# Patient Record
Sex: Female | Born: 1970 | Race: White | Hispanic: No | Marital: Single | Smoking: Never smoker
Health system: Southern US, Community
[De-identification: ages and names within clinical notes are randomized; demographics above are authoritative.]

## PROBLEM LIST (undated history)

## (undated) DIAGNOSIS — Z8719 Personal history of other diseases of the digestive system: Secondary | ICD-10-CM

## (undated) DIAGNOSIS — I10 Essential (primary) hypertension: Secondary | ICD-10-CM

## (undated) DIAGNOSIS — K439 Ventral hernia without obstruction or gangrene: Secondary | ICD-10-CM

## (undated) DIAGNOSIS — F329 Major depressive disorder, single episode, unspecified: Secondary | ICD-10-CM

## (undated) DIAGNOSIS — Z87442 Personal history of urinary calculi: Secondary | ICD-10-CM

## (undated) DIAGNOSIS — K5792 Diverticulitis of intestine, part unspecified, without perforation or abscess without bleeding: Secondary | ICD-10-CM

## (undated) DIAGNOSIS — N281 Cyst of kidney, acquired: Secondary | ICD-10-CM

## (undated) DIAGNOSIS — F410 Panic disorder [episodic paroxysmal anxiety] without agoraphobia: Secondary | ICD-10-CM

## (undated) DIAGNOSIS — F32A Depression, unspecified: Secondary | ICD-10-CM

## (undated) HISTORY — PX: TONSILLECTOMY: SUR1361

## (undated) HISTORY — PX: KIDNEY STONE SURGERY: SHX686

## (undated) HISTORY — PX: DILATION AND CURETTAGE OF UTERUS: SHX78

---

## 1998-09-13 HISTORY — PX: CHOLECYSTECTOMY: SHX55

## 2015-09-14 HISTORY — PX: COLECTOMY: SHX59

## 2018-02-24 ENCOUNTER — Emergency Department (INDEPENDENT_AMBULATORY_CARE_PROVIDER_SITE_OTHER)
Admission: EM | Admit: 2018-02-24 | Discharge: 2018-02-24 | Disposition: A | Discharge status: Home / Self Care | Payer: Self-pay | Source: Home / Self Care | Attending: Family Medicine | Admitting: Family Medicine

## 2018-02-24 ENCOUNTER — Encounter: Payer: Self-pay | Admitting: Emergency Medicine

## 2018-02-24 ENCOUNTER — Other Ambulatory Visit: Payer: Self-pay

## 2018-02-24 DIAGNOSIS — B070 Plantar wart: Secondary | ICD-10-CM

## 2018-02-24 HISTORY — DX: Major depressive disorder, single episode, unspecified: F32.9

## 2018-02-24 HISTORY — DX: Depression, unspecified: F32.A

## 2018-02-24 HISTORY — DX: Essential (primary) hypertension: I10

## 2018-02-24 NOTE — Discharge Instructions (Signed)
°  Please follow up with Dr. Benjamin Stainhekkekandam or with your family doctor for further evaluation and treatment of your plantar wart.  It will likely need continued treatment for several weeks to months.  The cryotherapy (freeze) treatment can only be done at certain time increments.  Please ask your family doctor when they would like you to follow up.  You can also try over the counter wart removal bandages/pads until you can follow up.  Keep wound covered to help prevent dirt and debris from getting into the skin as this can cause a skin infection.

## 2018-02-24 NOTE — ED Triage Notes (Signed)
47 y.o female presents c/o pain on the left center are of her foot. There is a pea sized lesion that is black in color. She states it has been there for about four years but over the last three weeks has grown in size and become more sensitive to touch/ walk on. She denies prior treatment.

## 2018-02-24 NOTE — ED Provider Notes (Signed)
[facility]    CSN: [id number] Arrival date & time: 02/24/18  1421     History   Chief Complaint Chief Complaint  Patient presents with  . Foot Pain    HPI Lauren Burke is a 47 y.o. female.   HPI Lauren Burke is a 47 y.o. female presenting to UC with c/o a painful black lesion on the bottom of her Left foot that has gradually worsened over the last 3 weeks.  She notes there has been a raised spot of skin in that area for about 4 years but it has not caused her problems until recently. No known injury. No new shoes or change in activities.  Pain is aching and sore, worse with weightbearing.    Past Medical History:  Diagnosis Date  . Depression   . Hypertension     There are no active problems to display for this patient.   History reviewed. No pertinent surgical history.  OB History   None      Home Medications    Prior to Admission medications   Medication Sig Start Date End Date Taking? Authorizing Provider  amLODipine-atorvastatin (CADUET) 5-10 MG tablet Take 1 tablet by mouth daily.   Yes [provider]  FLUoxetine (PROZAC) 40 MG capsule Take 40 mg by mouth daily.   Yes [provider]    Family History History reviewed. No pertinent family history.  Social History Social History   Tobacco Use  . Smoking status: Not on file  . Smokeless tobacco: Never Used  Substance Use Topics  . Alcohol use: Not Currently  . Drug use: Not Currently     Allergies   Morphine and related and Sulfa antibiotics   Review of Systems Review of Systems  Musculoskeletal: Negative for joint swelling and myalgias.  Skin: Positive for color change and wound. Negative for rash.     Physical Exam Triage Vital Signs ED Triage Vitals  Enc Vitals Group     BP 02/24/18 1452 138/88     Pulse Rate 02/24/18 1452 80     Resp --      Temp 02/24/18 1452 98.4 F (36.9 C)     Temp Source 02/24/18 1452 Oral     SpO2 02/24/18 1452 99 %     Weight 02/24/18 1453 179 lb (81.2 kg)     Height 02/24/18 1453 5\' 3"  (1.6 m)     Head Circumference --      Peak Flow --      Pain Score 02/24/18 1453 6     Pain Loc --      Pain Edu? --      Excl. in GC? --    No data found.  Updated Vital Signs BP 138/88 (BP Location: Right Arm)   Pulse 80   Temp 98.4 F (36.9 C) (Oral)   Ht 5\' 3"  (1.6 m)   Wt 179 lb (81.2 kg)   LMP 02/09/2018 (Approximate)   SpO2 99%   BMI 31.71 kg/m   Visual Acuity Right Eye Distance:   Left Eye Distance:   Bilateral Distance:    Right Eye Near:   Left Eye Near:    Bilateral Near:     Physical Exam  Constitutional: She is oriented to person, place, and time. She appears well-developed and well-nourished.  HENT:  Head: Normocephalic and atraumatic.  Eyes: EOM are normal.  Neck: Normal range of motion.  Cardiovascular: Normal rate.  Pulmonary/Chest: Effort normal.  Musculoskeletal: Normal range of  motion.       Feet:  Neurological: She is alert and oriented to person, place, and time.  Skin: Skin is warm and dry. Capillary refill takes less than 2 seconds.  Left foot, plantar aspect: 1cm circular area of thickened skin, dried blood underneath. Mildly tender. No bleeding or discharge. No red streaking.   Psychiatric: She has a normal mood and affect. Her behavior is normal.  Nursing note and vitals reviewed.    UC Treatments / Results  Labs (all labs ordered are listed, but only abnormal results are displayed) Labs Reviewed - No data to display  EKG None  Radiology No results found.  Procedures Procedures (including critical care time)  Medications Ordered in UC Medications - No data to display  Initial Impression / Assessment and Plan / UC Course  I have reviewed the triage vital signs and the nursing notes.  Pertinent labs & imaging results that were available during my care of the patient were reviewed by me and considered in my medical decision making (see chart for  details).     Hx and exam c/w plantar wart.  Wart cleaned with alcohol swab. Cryotherapy performed on wart.  Pt tolerated procedure well. Bandage applied.  Final Clinical Impressions(s) / UC Diagnoses   Final diagnoses:  Plantar wart of left foot     Discharge Instructions      Please follow up with Dr. Benjamin Stain or with your family doctor for further evaluation and treatment of your plantar wart.  It will likely need continued treatment for several weeks to months.  The cryotherapy (freeze) treatment can only be done at certain time increments.  Please ask your family doctor when they would like you to follow up.  You can also try over the counter wart removal bandages/pads until you can follow up.  Keep wound covered to help prevent dirt and debris from getting into the skin as this can cause a skin infection.      ED Prescriptions    None     Controlled Substance Prescriptions [place] Controlled Substance Registry consulted? Not Applicable   Rolla Plate 02/24/18 1533

## 2018-03-07 ENCOUNTER — Ambulatory Visit: Payer: Self-pay | Admitting: Podiatry

## 2021-06-03 ENCOUNTER — Other Ambulatory Visit: Payer: Self-pay

## 2021-06-03 ENCOUNTER — Encounter (HOSPITAL_COMMUNITY): Payer: Self-pay | Admitting: Student

## 2021-06-03 ENCOUNTER — Emergency Department (HOSPITAL_COMMUNITY)
Admission: EM | Admit: 2021-06-03 | Discharge: 2021-06-03 | Disposition: A | Discharge status: Home / Self Care | Payer: BLUE CROSS/BLUE SHIELD | Attending: Emergency Medicine | Admitting: Emergency Medicine

## 2021-06-03 ENCOUNTER — Emergency Department (HOSPITAL_COMMUNITY): Payer: BLUE CROSS/BLUE SHIELD

## 2021-06-03 DIAGNOSIS — N9489 Other specified conditions associated with female genital organs and menstrual cycle: Secondary | ICD-10-CM | POA: Diagnosis not present

## 2021-06-03 DIAGNOSIS — K449 Diaphragmatic hernia without obstruction or gangrene: Secondary | ICD-10-CM | POA: Diagnosis not present

## 2021-06-03 DIAGNOSIS — Z20822 Contact with and (suspected) exposure to covid-19: Secondary | ICD-10-CM | POA: Diagnosis not present

## 2021-06-03 DIAGNOSIS — N200 Calculus of kidney: Secondary | ICD-10-CM | POA: Insufficient documentation

## 2021-06-03 DIAGNOSIS — R45851 Suicidal ideations: Secondary | ICD-10-CM

## 2021-06-03 DIAGNOSIS — W134XXA Fall from, out of or through window, initial encounter: Secondary | ICD-10-CM | POA: Insufficient documentation

## 2021-06-03 DIAGNOSIS — R4182 Altered mental status, unspecified: Secondary | ICD-10-CM | POA: Diagnosis not present

## 2021-06-03 DIAGNOSIS — K6389 Other specified diseases of intestine: Secondary | ICD-10-CM | POA: Insufficient documentation

## 2021-06-03 DIAGNOSIS — S0031XA Abrasion of nose, initial encounter: Secondary | ICD-10-CM | POA: Diagnosis not present

## 2021-06-03 DIAGNOSIS — Z79899 Other long term (current) drug therapy: Secondary | ICD-10-CM | POA: Insufficient documentation

## 2021-06-03 DIAGNOSIS — Y9339 Activity, other involving climbing, rappelling and jumping off: Secondary | ICD-10-CM | POA: Diagnosis not present

## 2021-06-03 DIAGNOSIS — F418 Other specified anxiety disorders: Secondary | ICD-10-CM | POA: Diagnosis not present

## 2021-06-03 DIAGNOSIS — Z046 Encounter for general psychiatric examination, requested by authority: Secondary | ICD-10-CM | POA: Diagnosis not present

## 2021-06-03 DIAGNOSIS — R7989 Other specified abnormal findings of blood chemistry: Secondary | ICD-10-CM | POA: Diagnosis not present

## 2021-06-03 DIAGNOSIS — M50323 Other cervical disc degeneration at C6-C7 level: Secondary | ICD-10-CM | POA: Insufficient documentation

## 2021-06-03 DIAGNOSIS — M50322 Other cervical disc degeneration at C5-C6 level: Secondary | ICD-10-CM | POA: Diagnosis not present

## 2021-06-03 DIAGNOSIS — K429 Umbilical hernia without obstruction or gangrene: Secondary | ICD-10-CM | POA: Insufficient documentation

## 2021-06-03 DIAGNOSIS — I10 Essential (primary) hypertension: Secondary | ICD-10-CM | POA: Diagnosis not present

## 2021-06-03 DIAGNOSIS — W19XXXA Unspecified fall, initial encounter: Secondary | ICD-10-CM

## 2021-06-03 LAB — COMPREHENSIVE METABOLIC PANEL
ALT: 25 U/L (ref 0–44)
AST: 31 U/L (ref 15–41)
Albumin: 4.6 g/dL (ref 3.5–5.0)
Alkaline Phosphatase: 77 U/L (ref 38–126)
Anion gap: 13 (ref 5–15)
BUN: 15 mg/dL (ref 6–20)
CO2: 23 mmol/L (ref 22–32)
Calcium: 9.9 mg/dL (ref 8.9–10.3)
Chloride: 103 mmol/L (ref 98–111)
Creatinine, Ser: 1.32 mg/dL — ABNORMAL HIGH (ref 0.44–1.00)
GFR, Estimated: 49 mL/min — ABNORMAL LOW (ref >60)
Glucose, Bld: 132 mg/dL — ABNORMAL HIGH (ref 70–99)
Potassium: 4.2 mmol/L (ref 3.5–5.1)
Sodium: 139 mmol/L (ref 135–145)
Total Bilirubin: 1.5 mg/dL — ABNORMAL HIGH (ref 0.3–1.2)
Total Protein: 8.9 g/dL — ABNORMAL HIGH (ref 6.5–8.1)

## 2021-06-03 LAB — RESP PANEL BY RT-PCR (FLU A&B, COVID) ARPGX2
Influenza A by PCR: NEGATIVE
Influenza B by PCR: NEGATIVE
SARS Coronavirus 2 by RT PCR: NEGATIVE

## 2021-06-03 LAB — RAPID URINE DRUG SCREEN, HOSP PERFORMED
Amphetamines: NOT DETECTED
Barbiturates: NOT DETECTED
Benzodiazepines: NOT DETECTED
Cocaine: NOT DETECTED
Opiates: NOT DETECTED
Tetrahydrocannabinol: NOT DETECTED

## 2021-06-03 LAB — ETHANOL: Alcohol, Ethyl (B): <10 mg/dL (ref <10)

## 2021-06-03 LAB — CBC
HCT: 43.8 % (ref 36.0–46.0)
Hemoglobin: 14.5 g/dL (ref 12.0–15.0)
MCH: 28.4 pg (ref 26.0–34.0)
MCHC: 33.1 g/dL (ref 30.0–36.0)
MCV: 85.9 fL (ref 80.0–100.0)
Platelets: 414 10*3/uL — ABNORMAL HIGH (ref 150–400)
RBC: 5.1 MIL/uL (ref 3.87–5.11)
RDW: 13.5 % (ref 11.5–15.5)
WBC: 17.3 10*3/uL — ABNORMAL HIGH (ref 4.0–10.5)
nRBC: 0 % (ref 0.0–0.2)

## 2021-06-03 LAB — ACETAMINOPHEN LEVEL: Acetaminophen (Tylenol), Serum: <10 ug/mL — ABNORMAL LOW (ref 10–30)

## 2021-06-03 LAB — SALICYLATE LEVEL: Salicylate Lvl: <7 mg/dL — ABNORMAL LOW (ref 7.0–30.0)

## 2021-06-03 LAB — I-STAT BETA HCG BLOOD, ED (MC, WL, AP ONLY): I-stat hCG, quantitative: <5 m[IU]/mL (ref <5)

## 2021-06-03 MED ORDER — FLUOXETINE HCL 20 MG PO CAPS
20.0000 mg | ORAL_CAPSULE | Freq: Every day | ORAL | Status: DC
  Administered 2021-06-03: 20 mg via ORAL
  Filled 2021-06-03: qty 1

## 2021-06-03 MED ORDER — ALPRAZOLAM 0.5 MG PO TABS
0.5000 mg | ORAL_TABLET | Freq: Two times a day (BID) | ORAL | Status: DC | PRN
  Administered 2021-06-03: 0.5 mg via ORAL
  Filled 2021-06-03: qty 1

## 2021-06-03 MED ORDER — ACETAMINOPHEN 325 MG PO TABS: 650.0000 mg | ORAL_TABLET | ORAL | Status: DC | PRN

## 2021-06-03 MED ORDER — ALUM & MAG HYDROXIDE-SIMETH 200-200-20 MG/5ML PO SUSP: 30.0000 mL | Freq: Four times a day (QID) | ORAL | Status: DC | PRN

## 2021-06-03 MED ORDER — AMLODIPINE BESYLATE 5 MG PO TABS
5.0000 mg | ORAL_TABLET | Freq: Once | ORAL | Status: AC
  Administered 2021-06-03: 5 mg via ORAL
  Filled 2021-06-03: qty 1

## 2021-06-03 MED ORDER — ZOLPIDEM TARTRATE 5 MG PO TABS: 5.0000 mg | ORAL_TABLET | Freq: Every evening | ORAL | Status: DC | PRN

## 2021-06-03 MED ORDER — ONDANSETRON HCL 4 MG PO TABS: 4.0000 mg | ORAL_TABLET | Freq: Three times a day (TID) | ORAL | Status: DC | PRN

## 2021-06-03 MED ORDER — IOHEXOL 350 MG/ML SOLN
80.0000 mL | Freq: Once | INTRAVENOUS | Status: AC | PRN
  Administered 2021-06-03: 80 mL via INTRAVENOUS

## 2021-06-03 MED ORDER — SODIUM CHLORIDE 0.9 % IV BOLUS
1000.0000 mL | Freq: Once | INTRAVENOUS | Status: AC
  Administered 2021-06-03: 1000 mL via INTRAVENOUS

## 2021-06-03 NOTE — Discharge Instructions (Addendum)
For your chronic hernia, you should call to schedule follow-up appointment with the general surgeons office.  For your behavioral health needs, you are advised to follow up with the Partial Hospitalization Program (PHP) at the [facility] [place].  This program meets Monday - Friday from 9:00 am - 1:00 pm.  Due to Covid-19 this program is currently virtual.  You are scheduled for a virtual intake appointment on Wednesday, June 17, 2021 at 2:00 pm.  Please complete the registration packet given to you be Emergency Department staff in time for the appointment.  If you have any questions, contact Donia Guiles, LCSW at the phone number indicated below:       [facility]. [employer]. [address], [place] [address]      Contact person: Donia Guiles, LCSW      [phone]

## 2021-06-03 NOTE — Consult Note (Signed)
  Lauren Burke is a 50 y.o. female with a history of depression and hypertension who presents to the emergency department under IVC.   Per IVC paperwork: Respondent suffers from anxiety and depression.  Friend states respondent is not taking care of herself and has a bad odor to her.  Tonight respondent called 911 on herself.  Once officers arrived she refused to talk to them.  After police left she jumped out of second story window and got to her car and drove off.  Respondent has been overheard telling someone that the family she is living with her demons.  Respondent has been growling and cursing at children tonight.   Patient states that she is not totally sure why she is here.  She has no current complaints.  She denies having jumped out of a window.  She is hesitant to answer my questions. Level 5 caveat applies secondary to psychiatric condition.     Patient is seen, chart reviewed and assessed by Lucianne Muss. At this time patient does not present with any acute psychiatric concerns that warrant inpatient admission. Patient reports history of anxiety and panic disorder. SHe remains unclear as to what happened that presented her to the ED. However she is able to report that she does feel safe to return back home. She states she is sleeping ok, and appetite fair. She denies any paranoia, delusions, or psychosis. She does not appear to be responding to internal stimuli. She has a safe place to live, and rents a room from a friend. She is currently employed at [employer], and works from home.   She denies any suicidal ideations, homicidal ideations and or hallucinations. Attempt to call support people, however she does not provided or forward much information.   Psych cleared at this time, with outpatient resources.

## 2021-06-03 NOTE — ED Triage Notes (Signed)
Pt BIB [facility]. Pt jumped out of a 2 story window and has been acting aggressive and growling at kids.

## 2021-06-03 NOTE — ED Notes (Signed)
Pt changed out into burgundy color scrubs. Pt belongings were placed in belonging bag with label and placed in [place] C cabinet at nurse station.

## 2021-06-03 NOTE — ED Provider Notes (Signed)
[facility]-EMERGENCY DEPT Provider Note   CSN: [id number] Arrival date & time: 06/03/21  0109     History Chief Complaint  Patient presents with   IVC    Lauren Burke is a 50 y.o. female with a history of depression and hypertension who presents to the emergency department under IVC.  Per IVC paperwork: Respondent suffers from anxiety and depression.  Friend states respondent is not taking care of herself and has a bad odor to her.  Tonight respondent called 911 on herself.  Once officers arrived she refused to talk to them.  After police left she jumped out of second story window and got to her car and drove off.  Respondent has been overheard telling someone that the family she is living with her demons.  Respondent has been growling and cursing at children tonight.  Patient states that she is not totally sure why she is here.  She has no current complaints.  She denies having jumped out of a window.  She is hesitant to answer my questions. Level 5 caveat applies secondary to psychiatric condition.   HPI     Past Medical History:  Diagnosis Date   Depression    Hypertension     There are no problems to display for this patient.   History reviewed. No pertinent surgical history.   OB History   No obstetric history on file.     History reviewed. No pertinent family history.  Social History   Tobacco Use   Smokeless tobacco: Never  Substance Use Topics   Alcohol use: Not Currently   Drug use: Not Currently    Home Medications Prior to Admission medications   Medication Sig Start Date End Date Taking? Authorizing Provider  amLODipine-atorvastatin (CADUET) 5-10 MG tablet Take 1 tablet by mouth daily.    [provider]  FLUoxetine (PROZAC) 40 MG capsule Take 40 mg by mouth daily.    [provider]    Allergies    Morphine and related and Sulfa antibiotics  Review of Systems   Review of Systems  Unable to perform  ROS: Psychiatric disorder   Physical Exam Updated Vital Signs BP (!) 142/90 (BP Location: Left Arm)   Pulse (!) 111   Temp 98.1 F (36.7 C) (Oral)   Resp 17   SpO2 98%   Physical Exam Vitals and nursing note reviewed.  Constitutional:      General: She is not in acute distress.    Appearance: She is well-developed.  HENT:     Head: Normocephalic. No raccoon eyes or Battle's sign.     Comments: Abrasion to nose.    Right Ear: No hemotympanum.     Left Ear: No hemotympanum.  Eyes:     General:        Right eye: No discharge.        Left eye: No discharge.     Conjunctiva/sclera: Conjunctivae normal.     Pupils: Pupils are equal, round, and reactive to light.  Cardiovascular:     Rate and Rhythm: Normal rate and regular rhythm.     Heart sounds: No murmur heard. Pulmonary:     Effort: No respiratory distress.     Breath sounds: Normal breath sounds. No wheezing or rales.  Chest:     Chest wall: Tenderness (mild anterior chest wall) present.     Comments: No seatbelt sign to neck/chest/abdomen.  Abdominal:     General: There is no distension.  Palpations: Abdomen is soft.     Tenderness: There is no abdominal tenderness. There is no guarding or rebound.  Musculoskeletal:     Cervical back: Normal range of motion and neck supple. No spinous process tenderness.     Comments: Ues/Les: no obvious deformities, ranging @ all major joints. No focal bony tenderness.  Back: No point/focal vertebral tenderness or step off.  Skin:    General: Skin is warm and dry.     Findings: No rash.  Psychiatric:        Mood and Affect: Affect is flat.        Speech: Speech is delayed.    ED Results / Procedures / Treatments   Labs (all labs ordered are listed, but only abnormal results are displayed) Labs Reviewed  COMPREHENSIVE METABOLIC PANEL - Abnormal; Notable for the following components:      Result Value   Glucose, Bld 132 (*)    Creatinine, Ser 1.32 (*)    Total Protein  8.9 (*)    Total Bilirubin 1.5 (*)    GFR, Estimated 49 (*)    All other components within normal limits  SALICYLATE LEVEL - Abnormal; Notable for the following components:   Salicylate Lvl <7.0 (*)    All other components within normal limits  ACETAMINOPHEN LEVEL - Abnormal; Notable for the following components:   Acetaminophen (Tylenol), Serum <10 (*)    All other components within normal limits  CBC - Abnormal; Notable for the following components:   WBC 17.3 (*)    Platelets 414 (*)    All other components within normal limits  RESP PANEL BY RT-PCR (FLU A&B, COVID) ARPGX2  ETHANOL  RAPID URINE DRUG SCREEN, HOSP PERFORMED  I-STAT BETA HCG BLOOD, ED ([facility], [facility], [facility] ONLY)    EKG None  Radiology CT Head Wo Contrast  Result Date: 06/03/2021 CLINICAL DATA:  50 year old female jumped from 2 story window, altered mental status, agitation. EXAM: CT HEAD WITHOUT CONTRAST TECHNIQUE: Contiguous axial images were obtained from the base of the skull through the vertex without intravenous contrast. COMPARISON:  None. FINDINGS: Brain: Normal cerebral volume. No midline shift, ventriculomegaly, mass effect, evidence of mass lesion, intracranial hemorrhage or evidence of cortically based acute infarction. Gray-white matter differentiation is within normal limits throughout the brain. Vascular: No suspicious intracranial vascular hyperdensity. Skull: Intact, negative. Sinuses/Orbits: Visualized paranasal sinuses and mastoids are clear. Other: No acute orbit or scalp soft tissue injury identified. IMPRESSION: Normal noncontrast Head CT.   No acute traumatic injury identified. Electronically Signed   By: Odessa Fleming M.D.   On: 06/03/2021 06:17   CT Cervical Spine Wo Contrast  Result Date: 06/03/2021 CLINICAL DATA:  50 year old female jumped from 2 story window, altered mental status, agitation. EXAM: CT CERVICAL SPINE WITHOUT CONTRAST TECHNIQUE: Multidetector CT imaging of the cervical spine was performed  without intravenous contrast. Multiplanar CT image reconstructions were also generated. COMPARISON:  Head CT today. FINDINGS: Alignment: Reversal of cervical lordosis. Cervicothoracic junction alignment is within normal limits. Bilateral posterior element alignment is within normal limits. Skull base and vertebrae: Visualized skull base is intact. No atlanto-occipital dissociation. C1 and C2 appear intact and aligned. No acute osseous abnormality identified. Soft tissues and spinal canal: No prevertebral fluid or swelling. No visible canal hematoma. Negative visible noncontrast neck soft tissues. Disc levels: Chronic disc and endplate degeneration at C5-C6 and C6-C7, but no significant spinal stenosis suspected. Upper chest: Visible upper thoracic levels and chest appear intact. Chest CT today is  reported separately. IMPRESSION: 1. No acute traumatic injury identified in the cervical spine. 2. 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 Electronically Signed   By: Odessa Fleming M.D.   On: 06/03/2021 06:20   CT CHEST ABDOMEN PELVIS W CONTRAST  Result Date: 06/03/2021 CLINICAL DATA:  50 year old female jumped from 2 story window, altered mental status, agitation. EXAM: CT CHEST, ABDOMEN, AND PELVIS WITH CONTRAST TECHNIQUE: Multidetector CT imaging of the chest, abdomen and pelvis was performed following the standard protocol during bolus administration of intravenous contrast. CONTRAST:  14mL OMNIPAQUE IOHEXOL 350 MG/ML SOLN COMPARISON:  CT cervical spine today. FINDINGS: CT CHEST FINDINGS Cardiovascular: Mildly tortuous aortic arch and proximal great vessels, which otherwise appear intact and normal. No cardiomegaly or pericardial effusion. Mediastinum/Nodes: No mediastinal hematoma or lymphadenopathy. There is a small gastric hiatal hernia. Lungs/Pleura: Major airways are patent. Mild respiratory motion and atelectasis. Mild superimposed gas trapping in the right lower lobe. But otherwise both lungs appear  negative. No pneumothorax, pleural effusion, or pulmonary contusion. Musculoskeletal: Visible shoulder osseous structures appear intact. Intact sternum. Mild levoconvex thoracic scoliosis. No thoracic vertebral fracture. Occasional loss of rib detail due to respiratory motion. No acute rib fracture identified. CT ABDOMEN PELVIS FINDINGS Hepatobiliary: Absent gallbladder. Liver appears intact and negative. Pancreas: Fatty atrophied pancreas, otherwise negative. Spleen: Intact, negative. Adrenals/Urinary Tract: Normal adrenal glands. Left renal cortical scarring with superimposed small benign appearing renal cysts and punctate multifocal nephrolithiasis. Bulky 10 mm right mid to lower pole renal calculus but no hydronephrosis. Small benign right upper pole cyst. Decompressed ureters, with symmetric renal enhancement and contrast excretion. Diminutive and negative bladder. Stomach/Bowel: Roughly 3 cm left paraumbilical hernia containing several decompressed small bowel loops and mesenteric fat (series 4, image 82 and series 8, image 98). Cephalad to that is a small fat only containing hernia (series 4, image 63). Primary anastomosis at the rectosigmoid junction on series 4, image 104 is without adverse features. Otherwise negative rectum. Minimal residual sigmoid diverticula. No active inflammation. Retained stool from the right colon to the descending colon without active inflammation. Normal appendix on series 4 image 92. Terminal ileum is decompressed and negative. No dilated small bowel. Small hiatal hernia, intra-abdominal stomach is negative. Duodenal diverticula (series 4, image 56) with no active inflammation. No free air or free fluid. Vascular/Lymphatic: Major arterial structures in the abdomen and pelvis are patent with no significant atherosclerosis. Portal venous system is patent. No lymphadenopathy. Reproductive: Negative. Other: No pelvic free fluid. Musculoskeletal: Normal lumbar segmentation. Intact  lumbar vertebrae with moderate L5-S1 facet degeneration. Sacrum, SI joints, pelvis and proximal femurs appear intact. No superficial soft tissue injury identified. IMPRESSION: 1. No acute traumatic injury identified in the chest, abdomen, or pelvis. 2. Chronic left renal cortical scarring.  Bilateral nephrolithiasis. 3. Small hiatal hernia. Left paraumbilical hernia containing decompressed small bowel loops and mesenteric fat measures about 3 cm. Smaller, subtle supraumbilical hernia containing only fat. Rectosigmoid anastomosis with no adverse features. Electronically Signed   By: Odessa Fleming M.D.   On: 06/03/2021 06:33    Procedures Procedures   Medications Ordered in ED Medications  sodium chloride 0.9 % bolus 1,000 mL (has no administration in time range)    ED Course  I have reviewed the triage vital signs and the nursing notes.  Pertinent labs & imaging results that were available during my care of the patient were reviewed by me and considered in my medical decision making (see chart for details).    MDM Rules/Calculators/A&P  Patient presents to the ED for behavioral health assessment under IVC. Nontoxic, vitals w/ elevated BP, initial tachycardia improving, doubt HTN emergency.   Additional history obtained:  Additional history obtained from chart review & nursing note review.  Seen @ [facility] 06/01/21 - was diagnosed with panic disorder and prescribed prozac and xanax.   Lab Tests:  Screening labs have been reviewed including CBC, CMP, acetaminophen/salicylate/ethanol level, UDS:  Notable for leukocytosis, thrombocytosis, and elevated creatinine & T bili. Creatinine similar to prior.   ED Course:  There are reports that patient jumped out of a second story window this evening, she is an overall poor historian and is hesitant to answer my questions, given reports of trauma in combination with her mental status will obtain trauma scans.  Imaging:  I  ordered, reviewed & interpreted imaging including CT head, C spine, & chest/abdomen/pelvis:   CT head/Cspine: 1. No acute traumatic injury identified in the cervical spine. 2. Chronic C5-C6 and C6-C7 disc and endplate degeneration  CT Chest/abdomen/pelvis: 1. No acute traumatic injury identified in the chest, abdomen, or pelvis. 2. Chronic left renal cortical scarring.  Bilateral nephrolithiasis. 3. Small hiatal hernia. Left paraumbilical hernia containing decompressed small bowel loops and mesenteric fat measures about 3 cm. Smaller, subtle supraumbilical hernia containing only fat. Rectosigmoid anastomosis with no adverse features.   No trauma on CT. Hernias noted on CT, no obstruction, no tenderness on repeat abdominal exam.   Patient medically cleared, consult placed to TTS, disposition per [facility].   The patient has been placed in psychiatric observation due to the need to provide a safe environment for the patient while obtaining psychiatric consultation and evaluation, as well as ongoing medical and medication management to treat the patient's condition.  The patient has been placed under full IVC at this time.  Findings and plan of care discussed with supervising physician Dr. Nicanor Alcon who is in agreement.   Portions of this note were generated with Scientist, clinical (histocompatibility and immunogenetics). Dictation errors may occur despite best attempts at proofreading.  Final Clinical Impression(s) / ED Diagnoses Final diagnoses:  Fall  Paraumbilical hernia    Rx / DC Orders ED Discharge Orders     None        Cherly Anderson, PA-C 06/03/21 0158    Palumbo, April, MD 06/03/21 [phone]

## 2021-06-03 NOTE — BH Assessment (Signed)
[facility] Assessment Progress Note   Per Caryn Bee, NP, this pt does not require psychiatric hospitalization at this time.  Pt is psychiatrically cleared.  Pt presents under IVC initiated by a friend and upheld by EDP April Palumbo, MD which has been rescinded by Nelly Rout, MD.  Pt would benefit from the Partial Hospitalization Program at the [facility], which is currently being offered virtually due to Covid-19.  This Clinical research associate has spoken to pt and she would like to enroll; she adds that she has necessary IT support to participate in virtual programming.  I have contacted Donia Guiles, LCSW, who has scheduled pt for intake on Wednesday, 06/17/2021 at 14:00.  This has been included in pt's discharge instructions.  Pt has been given a registration packet for the Outpatient Clinic, which she has been instructed to complete prior to the appointed time.  EDP Alvester Chou, MD and pt's nurse, I-Li, have been notified.  Doylene Canning, MA Triage Specialist [phone]

## 2021-06-03 NOTE — ED Notes (Signed)
Patient transported to CT 

## 2021-06-03 NOTE — ED Provider Notes (Signed)
Patient evaluated by psychiatry service again and felt to be reasonably stable and safe for discharge with close psychiatric outpatient follow-up.  She has an intake appointment arranged.  IVC was rescinded by the psychiatry team.  Patient is otherwise medically cleared for discharge at this time.  She is noted to have a chronic periumbilical hernia, and provided follow-up information for general surgery.   Terald Sleeper, MD 06/03/21 [phone]

## 2021-06-04 ENCOUNTER — Encounter (HOSPITAL_COMMUNITY): Payer: Self-pay | Admitting: Emergency Medicine

## 2021-06-04 ENCOUNTER — Emergency Department (HOSPITAL_COMMUNITY)
Admission: EM | Admit: 2021-06-04 | Discharge: 2021-06-04 | Disposition: A | Discharge status: Home / Self Care | Payer: BLUE CROSS/BLUE SHIELD | Attending: Emergency Medicine | Admitting: Emergency Medicine

## 2021-06-04 DIAGNOSIS — R4182 Altered mental status, unspecified: Secondary | ICD-10-CM | POA: Insufficient documentation

## 2021-06-04 DIAGNOSIS — N9489 Other specified conditions associated with female genital organs and menstrual cycle: Secondary | ICD-10-CM | POA: Diagnosis not present

## 2021-06-04 DIAGNOSIS — Z5321 Procedure and treatment not carried out due to patient leaving prior to being seen by health care provider: Secondary | ICD-10-CM | POA: Insufficient documentation

## 2021-06-04 DIAGNOSIS — F419 Anxiety disorder, unspecified: Secondary | ICD-10-CM

## 2021-06-04 LAB — CBC WITH DIFFERENTIAL/PLATELET
Abs Immature Granulocytes: 0.13 10*3/uL — ABNORMAL HIGH (ref 0.00–0.07)
Basophils Absolute: 0.1 10*3/uL (ref 0.0–0.1)
Basophils Relative: 0 %
Eosinophils Absolute: 0.6 10*3/uL — ABNORMAL HIGH (ref 0.0–0.5)
Eosinophils Relative: 3 %
HCT: 41.2 % (ref 36.0–46.0)
Hemoglobin: 13.3 g/dL (ref 12.0–15.0)
Immature Granulocytes: 1 %
Lymphocytes Relative: 14 %
Lymphs Abs: 3.1 10*3/uL (ref 0.7–4.0)
MCH: 27.9 pg (ref 26.0–34.0)
MCHC: 32.3 g/dL (ref 30.0–36.0)
MCV: 86.6 fL (ref 80.0–100.0)
Monocytes Absolute: 1.3 10*3/uL — ABNORMAL HIGH (ref 0.1–1.0)
Monocytes Relative: 6 %
Neutro Abs: 16.6 10*3/uL — ABNORMAL HIGH (ref 1.7–7.7)
Neutrophils Relative %: 76 %
Platelets: 394 10*3/uL (ref 150–400)
RBC: 4.76 MIL/uL (ref 3.87–5.11)
RDW: 13.8 % (ref 11.5–15.5)
WBC: 21.8 10*3/uL — ABNORMAL HIGH (ref 4.0–10.5)
nRBC: 0 % (ref 0.0–0.2)

## 2021-06-04 LAB — ETHANOL: Alcohol, Ethyl (B): <10 mg/dL (ref <10)

## 2021-06-04 LAB — COMPREHENSIVE METABOLIC PANEL
ALT: 27 U/L (ref 0–44)
AST: 70 U/L — ABNORMAL HIGH (ref 15–41)
Albumin: 4.6 g/dL (ref 3.5–5.0)
Alkaline Phosphatase: 72 U/L (ref 38–126)
Anion gap: 14 (ref 5–15)
BUN: 36 mg/dL — ABNORMAL HIGH (ref 6–20)
CO2: 22 mmol/L (ref 22–32)
Calcium: 10.1 mg/dL (ref 8.9–10.3)
Chloride: 110 mmol/L (ref 98–111)
Creatinine, Ser: 1.61 mg/dL — ABNORMAL HIGH (ref 0.44–1.00)
GFR, Estimated: 39 mL/min — ABNORMAL LOW (ref >60)
Glucose, Bld: 94 mg/dL (ref 70–99)
Potassium: 3.8 mmol/L (ref 3.5–5.1)
Sodium: 146 mmol/L — ABNORMAL HIGH (ref 135–145)
Total Bilirubin: 1.5 mg/dL — ABNORMAL HIGH (ref 0.3–1.2)
Total Protein: 9 g/dL — ABNORMAL HIGH (ref 6.5–8.1)

## 2021-06-04 LAB — I-STAT BETA HCG BLOOD, ED (MC, WL, AP ONLY): I-stat hCG, quantitative: <5 m[IU]/mL (ref <5)

## 2021-06-04 LAB — ACETAMINOPHEN LEVEL: Acetaminophen (Tylenol), Serum: <10 ug/mL — ABNORMAL LOW (ref 10–30)

## 2021-06-04 LAB — SALICYLATE LEVEL: Salicylate Lvl: <7 mg/dL — ABNORMAL LOW (ref 7.0–30.0)

## 2021-06-04 NOTE — ED Notes (Signed)
Pt is in the ED lobby, touching and rubbing on another pt. Pt expressed repeatedly that she is concerned for his health. We informed the pt to please refrain from touching other pt's and to please mind other pt's privacy. Pt continued to repeatedly come to the ED desk and ask for a nurse. 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

## 2021-06-04 NOTE — ED Notes (Signed)
Patient was taken back to treatment room. Patient walked out of room into the lobby. Patient announced to the lobby that she "did not want to be seen" and that her "peers needed to be seen". Patient is getting everybody in lobby hyped up and upset.

## 2021-06-04 NOTE — ED Notes (Signed)
Patient escorted off the property by security. When [employer] arrived daughter refused for pt to go to jail and decided to take the patient home.

## 2021-06-04 NOTE — ED Provider Notes (Signed)
Emergency Medicine Provider Triage Evaluation Note  Lauren Burke , a 50 y.o. female  was evaluated in triage.  Pt complains of her mental status.  Brought in by EMS, was found to be wandering around someone's yard.  According to patient she does not recall leaving the hospital.  She was psychiatrically cleared yesterday however returns today after wandering around.  States she has been walking since her discharge yesterday.  Denies any SI, HI, visual or auditory hallucinations.  Prior history of mental health including anxiety and depression.  Review of Systems  Positive: confused Negative: Fever, chest pain, headache, abdominal pain  Physical Exam  BP (!) 127/93   Pulse 98   Temp 98.9 F (37.2 C) (Oral)   Resp 16   SpO2 100%  Gen:   Awake, no distress   Resp:  Normal effort  MSK:   Moves extremities without difficulty  Other:  AOX4, moves all upper and lower extremities, answers questions appropriately.  Dilatory in the ED with a steady gait.  Medical Decision Making  Medically screening exam initiated at 1:35 PM.  Appropriate orders placed.  Lauren Burke was informed that the remainder of the evaluation will be completed by another provider, this initial triage assessment does not replace that evaluation, and the importance of remaining in the ED until their evaluation is complete.  Patient evaluated yesterday for similar complaints.  Arrived today via EMS after wandering someone's yard.  Was psychiatrically clear yesterday with outpatient resources.  States since her discharge she has been walking around.  She currently does not live with anyone.  She is denying any harm to herself, HI, visual or auditory hallucinations.  Does take medication for anxiety and depression however has not done so since her last visit to the ED yesterday.   [name], PA-C 06/04/21 1337    Alvira Monday, MD 06/05/21 [phone]

## 2021-06-04 NOTE — ED Notes (Signed)
I have to repeatedly inform pt to please mind other pt's privacy. The pt continues to walk up to lobby staff and state "I'm concerned for this pt, he's been here since 10AM."

## 2021-06-04 NOTE — ED Triage Notes (Signed)
Per EMS-patient wandered in someone's yard and they called 911-was able to give name and birth date to EMS-was seen at this facility yesterday-slow to answer questions-[facility] 119

## 2021-06-04 NOTE — ED Notes (Signed)
Pt is taking purple sani-cloth wipes and wiping down chairs and attempting to clean vital signs monitoring system. Pt is still yelling and raising her voice which is causing other pt's to become upset and agitated over wait times. We have to repeatedly inform pt to please refrain from touching the cleaning wipes and keep her voice down.

## 2021-06-04 NOTE — ED Notes (Signed)
Pt continues to to provoke other patients and causing other pt's to become agitated due to the wait times. I have advised security and [employer]. Pt continues to ask other pt's regarding their visit.

## 2021-06-17 ENCOUNTER — Other Ambulatory Visit: Payer: Self-pay

## 2021-06-17 ENCOUNTER — Other Ambulatory Visit (HOSPITAL_COMMUNITY): Payer: Self-pay | Attending: Psychiatry

## 2021-06-17 ENCOUNTER — Telehealth (HOSPITAL_COMMUNITY): Payer: Self-pay | Admitting: Licensed Clinical Social Worker

## 2022-01-13 ENCOUNTER — Emergency Department (INDEPENDENT_AMBULATORY_CARE_PROVIDER_SITE_OTHER)
Admission: EM | Admit: 2022-01-13 | Discharge: 2022-01-13 | Disposition: A | Discharge status: Home / Self Care | Payer: BLUE CROSS/BLUE SHIELD | Source: Home / Self Care

## 2022-01-13 ENCOUNTER — Emergency Department (INDEPENDENT_AMBULATORY_CARE_PROVIDER_SITE_OTHER): Payer: BLUE CROSS/BLUE SHIELD

## 2022-01-13 DIAGNOSIS — L980 Pyogenic granuloma: Secondary | ICD-10-CM | POA: Diagnosis not present

## 2022-01-13 DIAGNOSIS — R102 Pelvic and perineal pain: Secondary | ICD-10-CM | POA: Diagnosis not present

## 2022-01-13 DIAGNOSIS — M25552 Pain in left hip: Secondary | ICD-10-CM | POA: Diagnosis not present

## 2022-01-13 MED ORDER — MELOXICAM 7.5 MG PO TABS: 7.5000 mg | ORAL_TABLET | Freq: Every day | ORAL | 0 refills | Status: DC

## 2022-01-13 NOTE — Discharge Instructions (Signed)
Your hip xray shows no abnormalities. ?Please start taking meloxicam daily in the morning with food. ?Please follow up with your PCP if symptoms persist. ?Restart your fluoxetine. ?Please call dermatology for evaluation of your foot lesion. ?

## 2022-01-13 NOTE — ED Triage Notes (Addendum)
Pt c/o LT upper leg pain x 2 weeks. Worsening in last couple days. Last night radiating down leg. Denies injury. Pain 2/10 Also c/o of sore on bottom of LT foot that is throbbing. Seen a few years ago for it and states they froze it. ?

## 2022-01-16 NOTE — ED Provider Notes (Signed)
?[facility] ? ? ? ?CSN: [id number] ?Arrival date & time: 01/13/22  1805 ? ? ?  ? ?History   ?Chief Complaint ?Chief Complaint  ?Patient presents with  ? Leg Pain  ?  LT upper  ? Foot Pain  ?  LT, bottom of foot  ? ? ?HPI ?Silvia Eggers is a 51 y.o. female.  ? ?Pleasant 51yo female presents today with complaints of Left hip pain and L foot pain. She states it started in November of 2022 after being discharged from the psychiatric hospital. She has not been seen for it since and states she has "just been dealing with it." She denies any injury or trauma to the hip. She states it is point tender to the L ASIS bone, denies any muscle pain. No change in ROM and no decreased strength. She has not been taking any OTCs and has not tried any additional treatments. She denies radicular symptoms. It is a 2/10 most days, worst it has been is a 4/10. In office, currently a 0/10 until palpated. No change to the skin.  ?Pt also reports a somewhat "uncomfortable lump" to the bottom of her left foot. She tried to have it frozen off, but states that was unsuccessful. There is only one lesion, denies any growth to it. No additional treatments tried. ?Pt just saw her PCP yesterday who is managing her depression and anxiety.  ? ?Leg Pain ?Foot Pain ? ? ?Past Medical History:  ?Diagnosis Date  ? Depression   ? Hypertension   ? ? ?There are no problems to display for this patient. ? ? ?History reviewed. No pertinent surgical history. ? ?OB History   ?No obstetric history on file. ?  ? ? ? ?Home Medications   ? ?Prior to Admission medications   ?Medication Sig Start Date End Date Taking? Authorizing Provider  ?meloxicam (MOBIC) 7.5 MG tablet Take 1 tablet (7.5 mg total) by mouth daily. 01/13/22  Yes Kaysha Parsell L, PA  ?ALPRAZolam (XANAX) 0.5 MG tablet Take 0.5 mg by mouth 2 (two) times daily as needed for anxiety. 05/09/21   [provider]  ?amLODipine (NORVASC) 5 MG tablet Take 5 mg by mouth daily. 05/15/21   [provider]  ?FLUoxetine (PROZAC) 20 MG capsule Take 20 mg by mouth daily. 05/15/21   [provider]  ?MELATONIN PO Take 1 tablet by mouth at bedtime as needed (sleep).    [provider]  ? ? ?Family History ?History reviewed. No pertinent family history. ? ?Social History ?Social History  ? ?Tobacco Use  ? Smokeless tobacco: Never  ?Substance Use Topics  ? Alcohol use: Not Currently  ? Drug use: Not Currently  ? ? ? ?Allergies   ?Morphine and related and Sulfa antibiotics ? ? ?Review of Systems ?Review of Systems  ?Musculoskeletal:  Positive for arthralgias (L anterior hip pain).  ?Skin:   ?     Left plantar foot lesion  ?Psychiatric/Behavioral:  Negative for self-injury and suicidal ideas. The patient is nervous/anxious (managed by PCP).   ?All other systems reviewed and are negative. ? ? ?Physical Exam ?Triage Vital Signs ?ED Triage Vitals  ?Enc Vitals Group  ?   BP 01/13/22 1901 (!) 142/100  ?   Pulse Rate 01/13/22 1901 75  ?   Resp 01/13/22 1901 16  ?   Temp 01/13/22 1901 98.2 ?F (36.8 ?C)  ?   Temp Source 01/13/22 1901 Oral  ?   SpO2 01/13/22 1901 99 %  ?  Weight --   ?   Height --   ?   Head Circumference --   ?   Peak Flow --   ?   Pain Score 01/13/22 1817 2  ?   Pain Loc --   ?   Pain Edu? --   ?   Excl. in [place]? --   ? ?No data found. ? ?Updated Vital Signs ?BP (!) 142/100 (BP Location: Right Arm)   Pulse 75   Temp 98.2 ?F (36.8 ?C) (Oral)   Resp 16   LMP 02/09/2018 (Approximate)   SpO2 99%  ? ?Visual Acuity ?Right Eye Distance:   ?Left Eye Distance:   ?Bilateral Distance:   ? ?Right Eye Near:   ?Left Eye Near:    ?Bilateral Near:    ? ?Physical Exam ?Vitals and nursing note reviewed.  ?Constitutional:   ?   General: She is not in acute distress. ?   Appearance: Normal appearance. She is not ill-appearing, toxic-appearing or diaphoretic.  ?HENT:  ?   Head: Normocephalic and atraumatic.  ?   Right Ear: External ear normal.  ?   Left Ear: External ear normal.  ?Cardiovascular:  ?    Rate and Rhythm: Normal rate and regular rhythm.  ?   Pulses: Normal pulses.  ?Pulmonary:  ?   Effort: Pulmonary effort is normal.  ?   Breath sounds: Normal breath sounds. No wheezing or rhonchi.  ?Abdominal:  ?   General: Abdomen is flat. Bowel sounds are normal. There is no distension.  ?   Tenderness: There is no abdominal tenderness. There is no right CVA tenderness, left CVA tenderness or guarding.  ?Musculoskeletal:  ?   Cervical back: Normal range of motion and neck supple. No rigidity.  ?   Right hip: Normal. No deformity, lacerations, tenderness, bony tenderness or crepitus. Normal range of motion. Normal strength.  ?   Left hip: Bony tenderness (point tenderness to ASIS) present. No deformity, lacerations or crepitus. Normal range of motion. Normal strength.  ?   Right upper leg: Normal. No swelling, edema, deformity, lacerations, tenderness or bony tenderness.  ?   Left upper leg: Normal. No swelling, edema, deformity, lacerations, tenderness or bony tenderness.  ?   Right knee: Normal.  ?   Left knee: Normal.  ?   Right lower leg: Normal. No swelling. No edema.  ?   Left lower leg: Normal. No swelling. No edema.  ?   Right foot: Normal pulse.  ?   Left foot: Tenderness (single 3-52mm erythematous lesion to L foot plantar aspect near 2nd MTP) present. Normal pulse.  ?   Comments: Negative faber bilaterally  ?Lymphadenopathy:  ?   Cervical: No cervical adenopathy.  ?Neurological:  ?   Mental Status: She is alert.  ? ? ? ?UC Treatments / Results  ?Labs ?(all labs ordered are listed, but only abnormal results are displayed) ?Labs Reviewed - No data to display ? ?EKG ? ? ?Radiology ?No results found. ? ?Procedures ?Procedures (including critical care time) ? ?Medications Ordered in UC ?Medications - No data to display ? ?Initial Impression / Assessment and Plan / UC Course  ?I have reviewed the triage vital signs and the nursing notes. ? ?Pertinent labs & imaging results that were available during my care of  the patient were reviewed by me and considered in my medical decision making (see chart for details). ? ?  ? ?Pyogenic granuloma - most likely cause of the skin lesion to L  foot. Does not have any characteristics of a plantar wart. Recommended dermatology eval for further treatment options. ?L hip pain - xray does not reveal any acute abnormalities. Point tenderness to ASIS. Question if this may be related to inflammation/ tendonitis of sartorius vs tensor fasciae latae? No indications for advanced imaging at present time, will do tried of NSAIDs, if sx persist, worsen or change, RTC for further eval. Hip stretches may be indicated. ?Depression/anxiety - managed by PCP. Saw PCP yesterday. Pt has not been taking fluoxetine, but has it at home.  ? ?Final Clinical Impressions(s) / UC Diagnoses  ? ?Final diagnoses:  ?Pyogenic granuloma of skin  ?Pain of left hip  ? ? ? ?Discharge Instructions   ? ?  ?Your hip xray shows no abnormalities. ?Please start taking meloxicam daily in the morning with food. ?Please follow up with your PCP if symptoms persist. ?Restart your fluoxetine. ?Please call dermatology for evaluation of your foot lesion. ? ? ?ED Prescriptions   ? ? Medication Sig Dispense Auth. Provider  ? meloxicam (MOBIC) 7.5 MG tablet Take 1 tablet (7.5 mg total) by mouth daily. 30 tablet Neibert, [place] L, PA  ? ?  ? ?PDMP not reviewed this encounter. ?

## 2022-10-08 IMAGING — CT CT HEAD W/O CM
4 series · 16 of 47 positions shown, 18 images · non-contrast
Comparison: None.

CLINICAL DATA: 50-year-old female jumped from 2 story window,
altered mental status, agitation.

EXAM:
CT HEAD WITHOUT CONTRAST
TECHNIQUE: Contiguous axial images were obtained from the base of the skull
through the vertex without intravenous contrast.

[Series 1: head wo · axial · 0.47mm/px · z∈[-78,+22]mm · 7 of 28 slices shown, 9 images]
[im 4/28  brain]
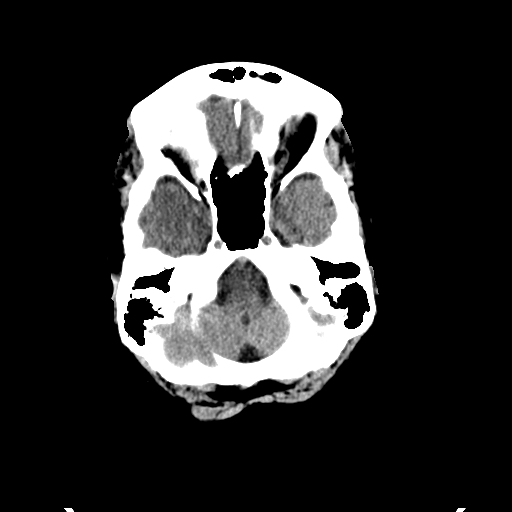
[im 4/28  bone]
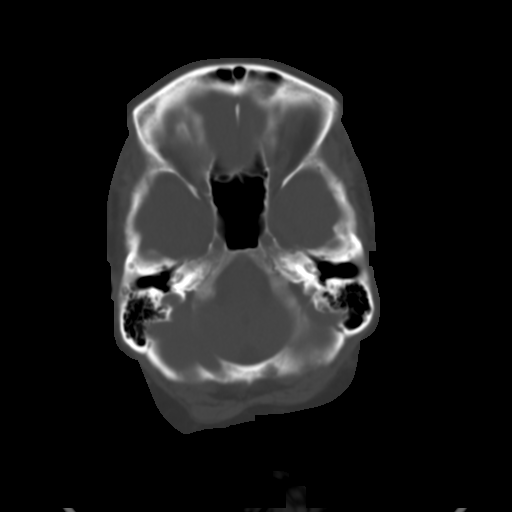
[im 7/28  brain]
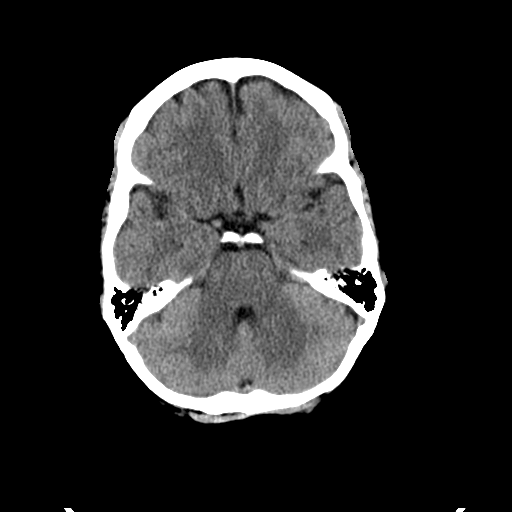
[im 11/28  brain]
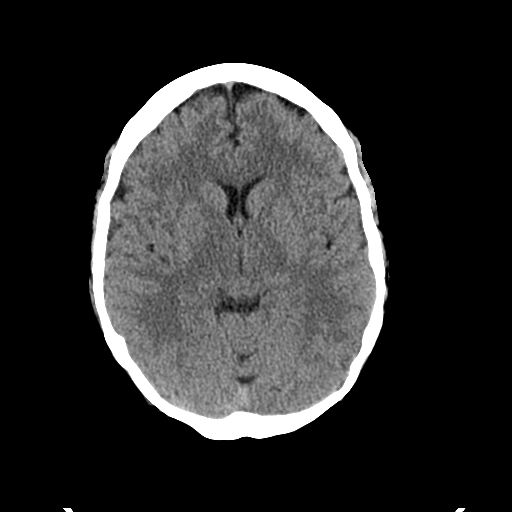
[im 14/28  brain]
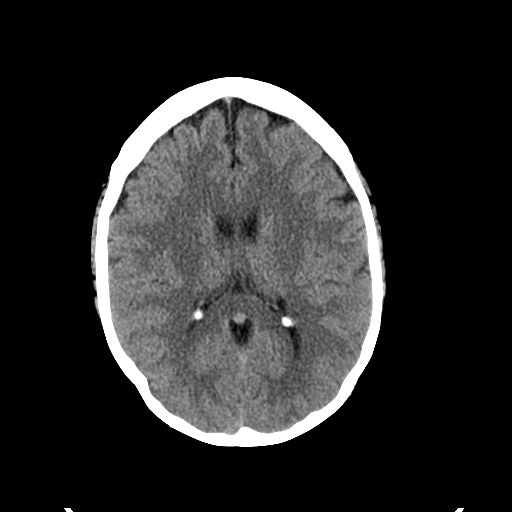
[im 17/28  brain]
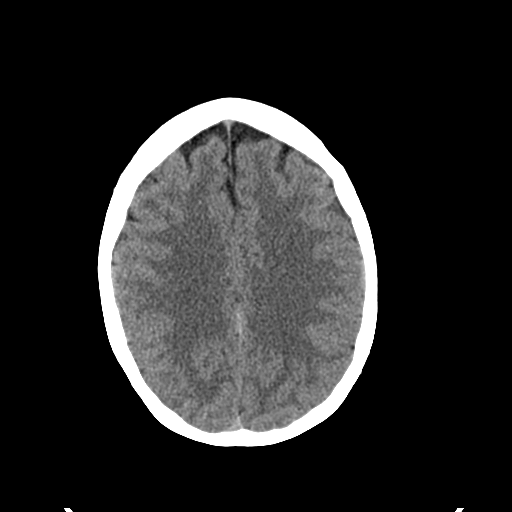
[im 17/28  bone]
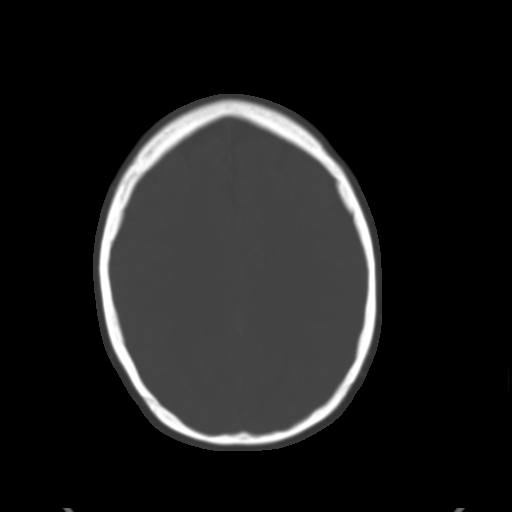
[im 21/28  brain]
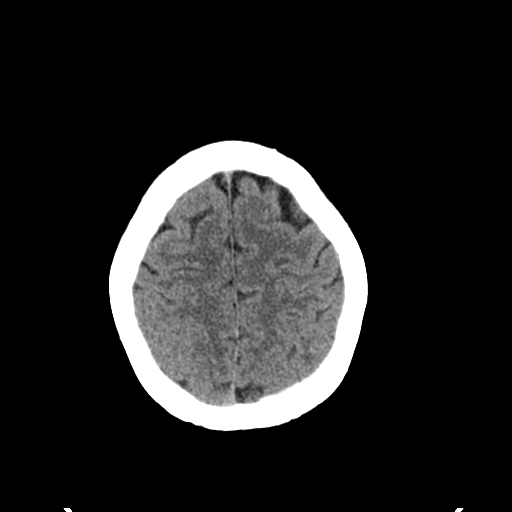
[im 24/28  brain]
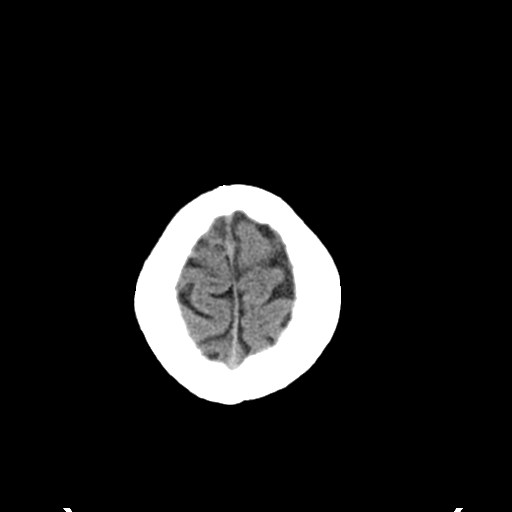

[Series 5: head bone · axial · 0.47mm/px · z∈[-81,-53]mm · 3 of 69 slices shown]
[im 7/69  bone]
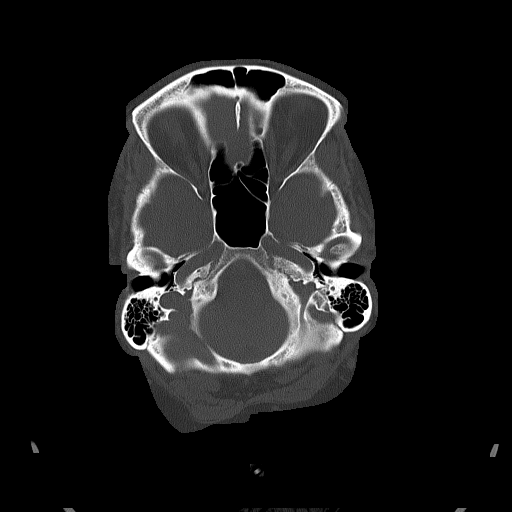
[im 14/69  bone]
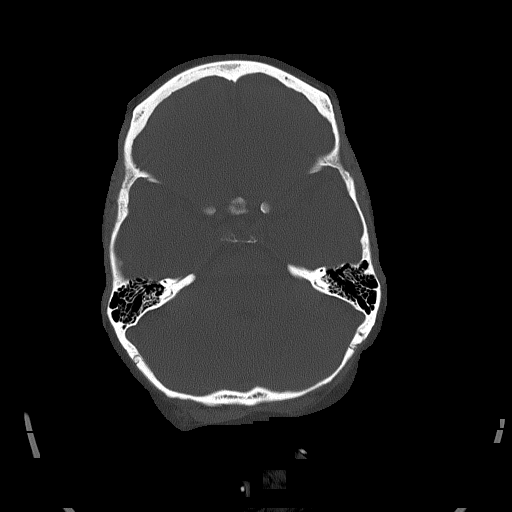
[im 21/69  bone]
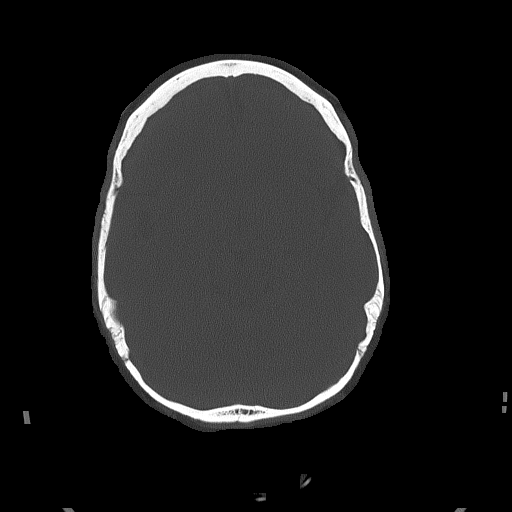

[Series 6: coronal soft tissue · coronal · 0.29mm/px · 3 of 64 slices shown]
[im 22/64  brain]
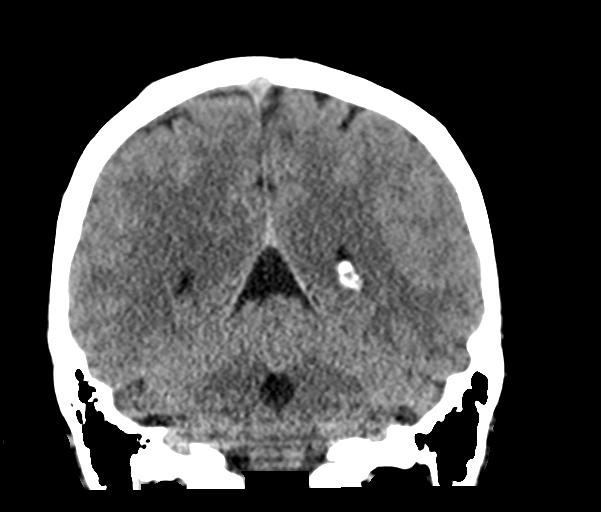
[im 29/64  brain]
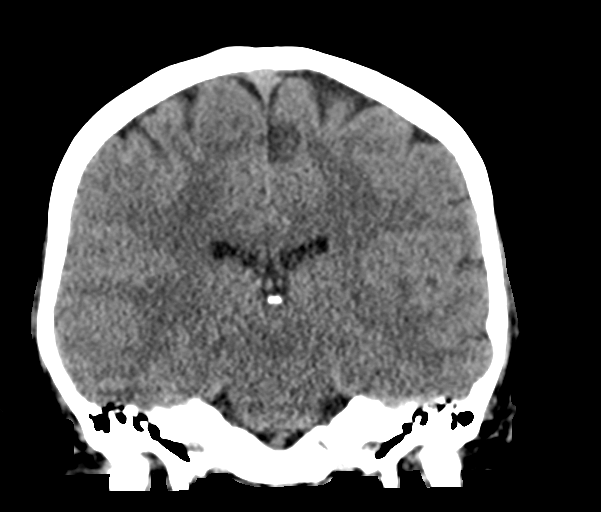
[im 36/64  brain]
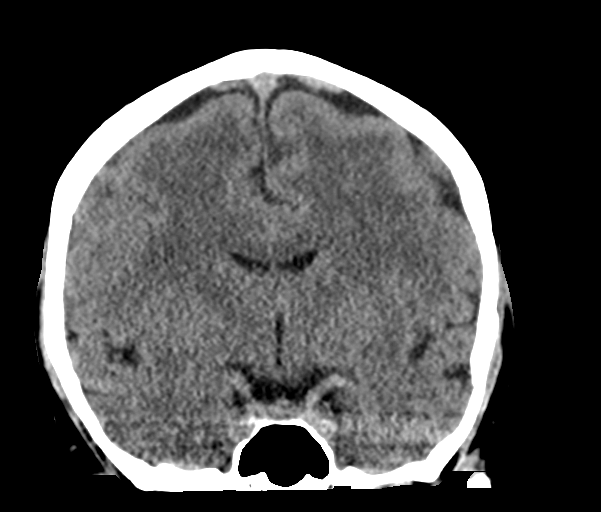

[Series 7: sagittal soft tissue · sagittal · 0.29mm/px · 3 of 59 slices shown]
[im 20/59  brain]
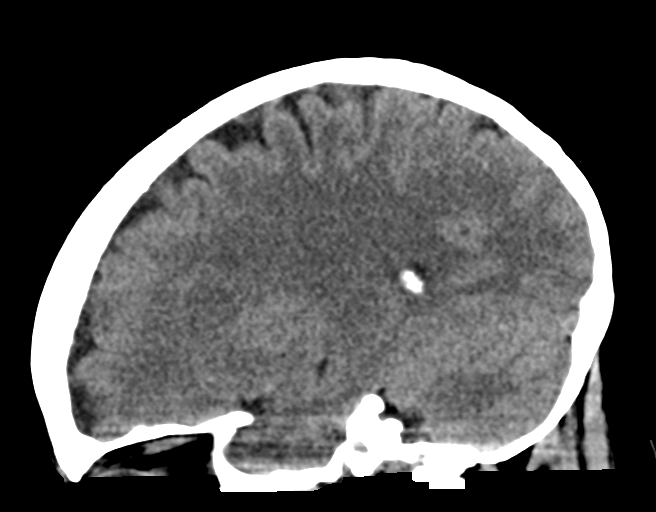
[im 30/59  brain]
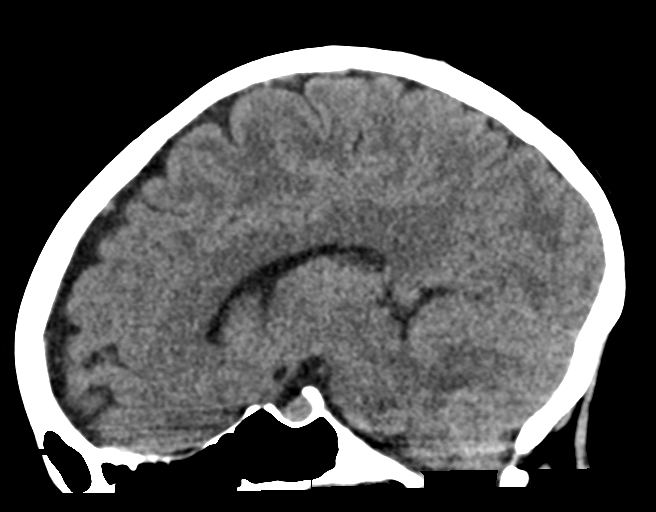
[im 39/59  brain]
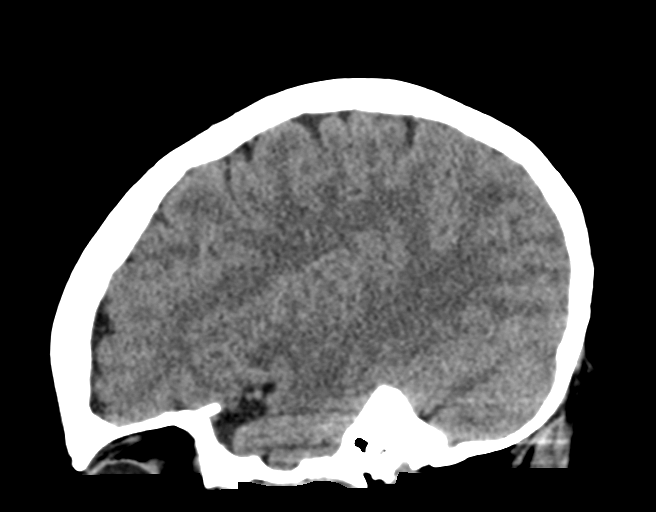

[16 of 47 positions shown; findings below may reference images not displayed]

FINDINGS: Brain: Normal cerebral volume. No midline shift, ventriculomegaly,
mass effect, evidence of mass lesion, intracranial hemorrhage or
evidence of cortically based acute infarction. Gray-white matter
differentiation is within normal limits throughout the brain.

Vascular: No suspicious intracranial vascular hyperdensity.

Skull: Intact, negative.

Sinuses/Orbits: Visualized paranasal sinuses and mastoids are clear.

Other: No acute orbit or scalp soft tissue injury identified.
IMPRESSION: Normal noncontrast Head CT.   No acute traumatic injury identified.

## 2022-10-08 IMAGING — CT CT CERVICAL SPINE W/O CM
3 of 4 series · 14 of 33 positions shown, 17 images · non-contrast
Comparison: Head CT today.

CLINICAL DATA: 50-year-old female jumped from 2 story window,
altered mental status, agitation.

EXAM:
CT CERVICAL SPINE WITHOUT CONTRAST
TECHNIQUE: Multidetector CT imaging of the cervical spine was performed without
intravenous contrast. Multiplanar CT image reconstructions were also
generated.

[Series 8: sagittal bone · sagittal · 0.26mm/px · 5 of 64 slices shown, 6 images]
[im 22/64  bone]
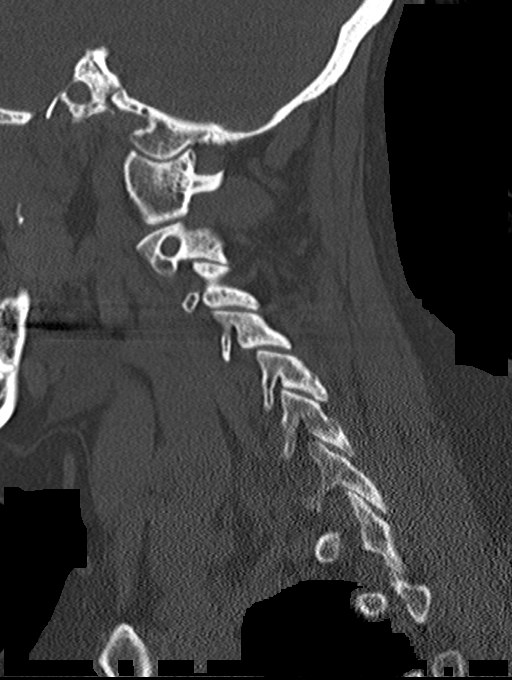
[im 27/64  bone]
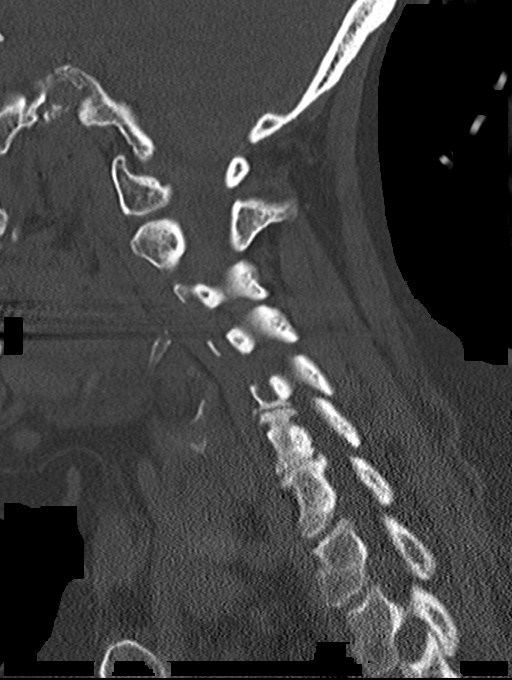
[im 32/64  soft-tissue]
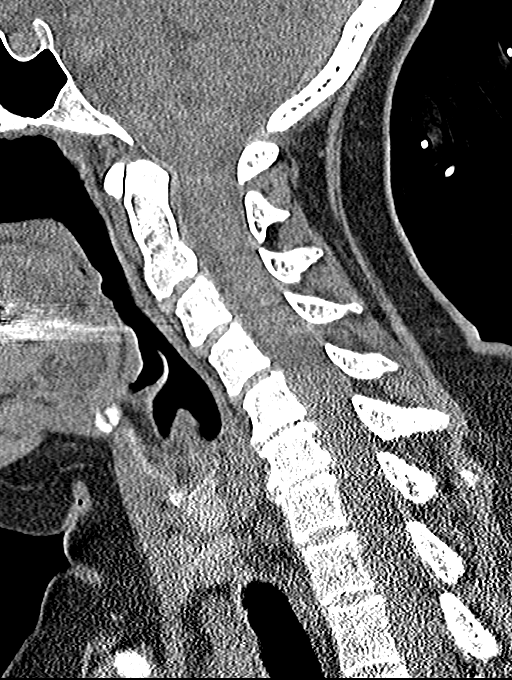
[im 32/64  bone]
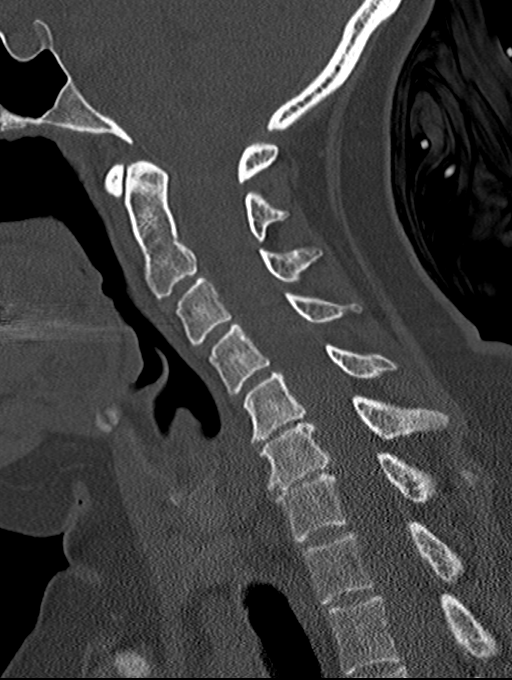
[im 37/64  bone]
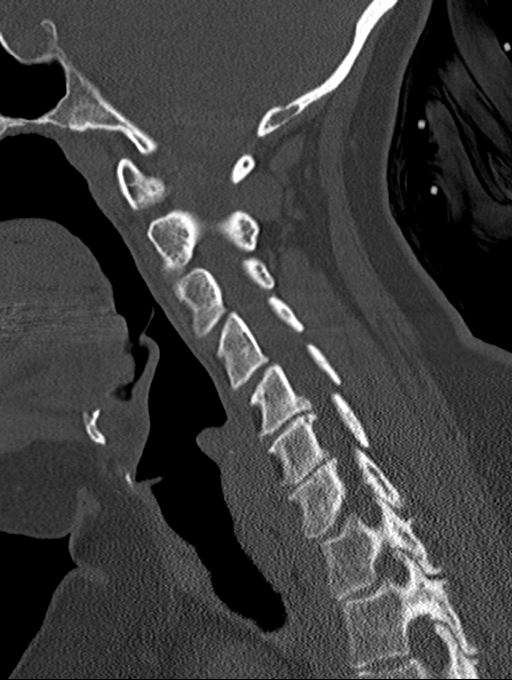
[im 43/64  bone]
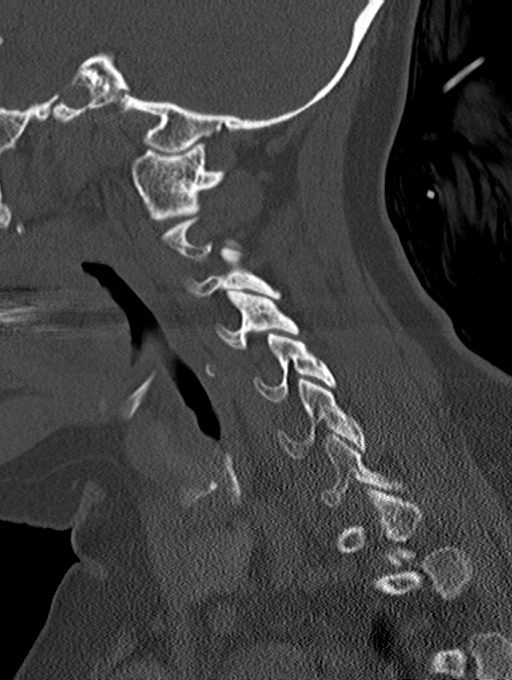

[Series 9: coronal bone · coronal · 0.32mm/px · 3 of 61 slices shown]
[im 17/61  bone]
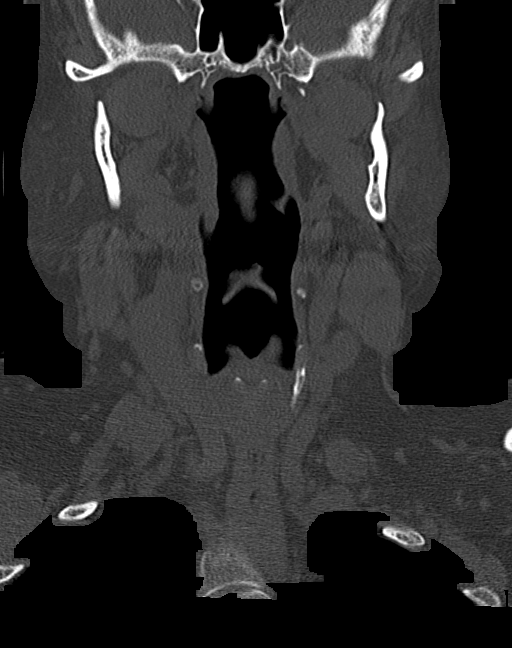
[im 26/61  bone]
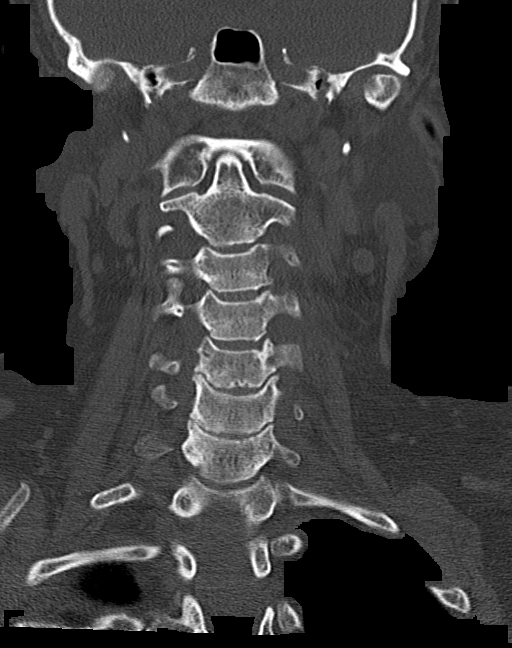
[im 35/61  bone]
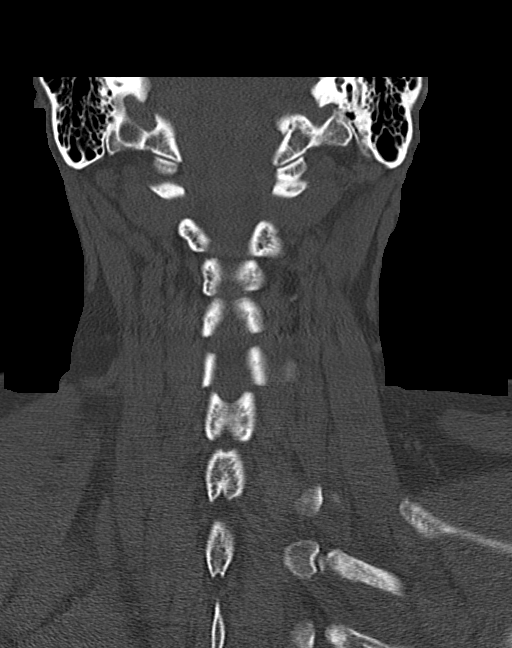

[Series 10: orthogonal bone · axial · 0.21mm/px · z∈[-240,-152]mm · 6 of 107 slices shown, 8 images]
[im 16/107  soft-tissue]
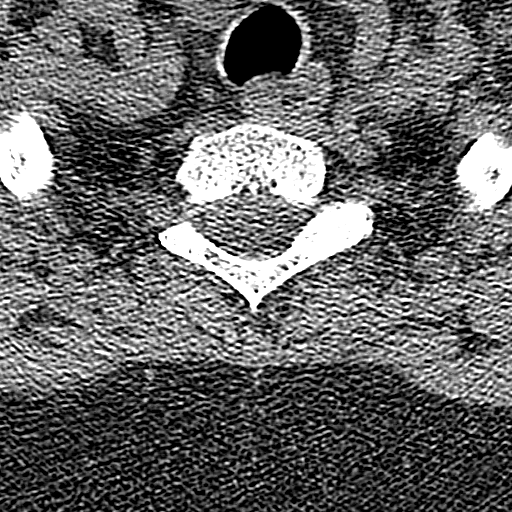
[im 16/107  bone]
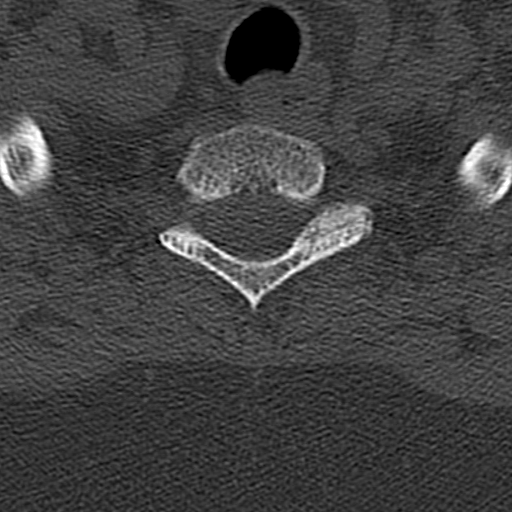
[im 31/107  bone]
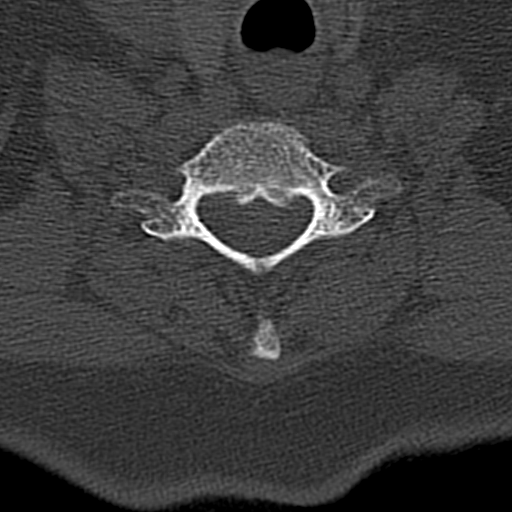
[im 46/107  bone]
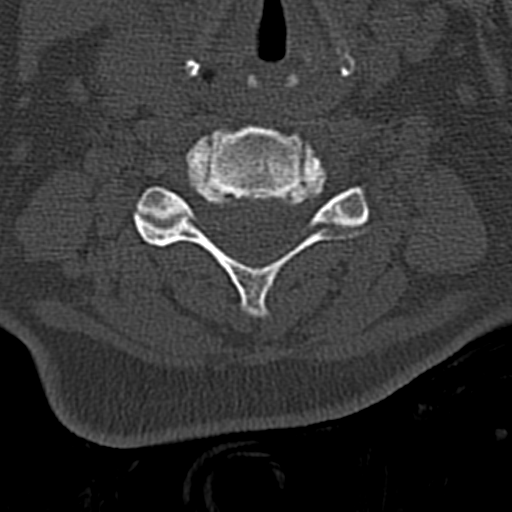
[im 61/107  bone]
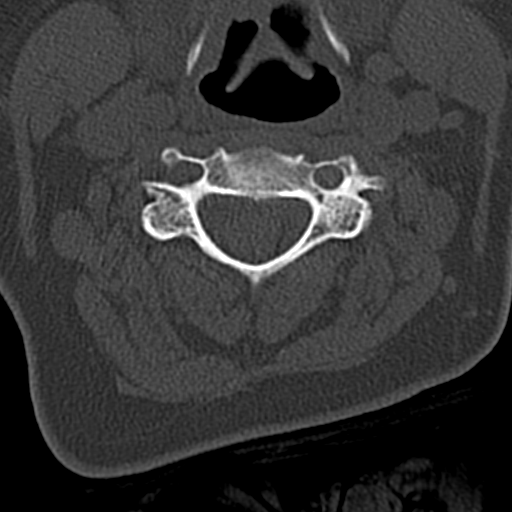
[im 76/107  soft-tissue]
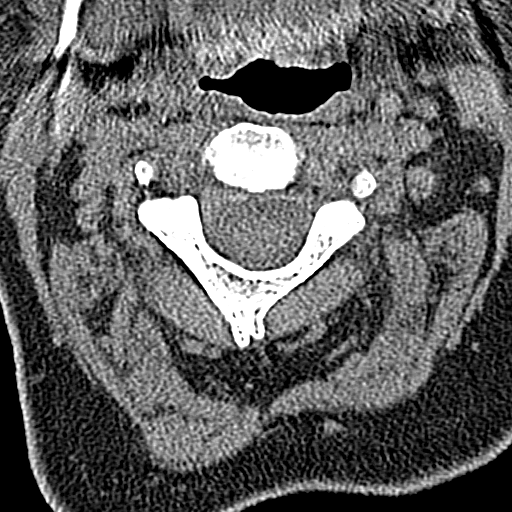
[im 76/107  bone]
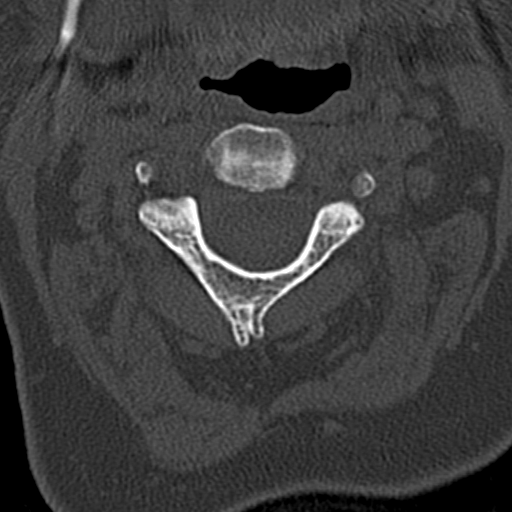
[im 91/107  bone]
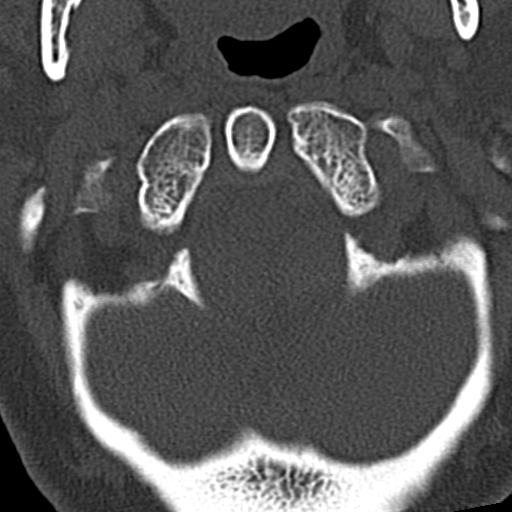

[14 of 33 positions shown; findings below may reference images not displayed]

FINDINGS: Alignment: Reversal of cervical lordosis. Cervicothoracic junction
alignment is within normal limits. Bilateral posterior element
alignment is within normal limits.

Skull base and vertebrae: Visualized skull base is intact. No
atlanto-occipital dissociation. C1 and C2 appear intact and aligned.
No acute osseous abnormality identified.

Soft tissues and spinal canal: No prevertebral fluid or swelling. No
visible canal hematoma. Negative visible noncontrast neck soft
tissues.

Disc levels: Chronic disc and endplate degeneration at C5-C6 and
C6-C7, but no significant spinal stenosis suspected.

Upper chest: Visible upper thoracic levels and chest appear intact.
Chest CT today is reported separately.
IMPRESSION: 1. No acute traumatic injury identified in the cervical spine.
2. Chronic C5-C6 and C6-C7 disc and endplate degeneration.

## 2023-06-02 ENCOUNTER — Ambulatory Visit (INDEPENDENT_AMBULATORY_CARE_PROVIDER_SITE_OTHER): Payer: Self-pay | Admitting: Surgery

## 2023-06-02 ENCOUNTER — Encounter: Payer: Self-pay | Admitting: Surgery

## 2023-06-02 VITALS — BP 140/89 | HR 89 | Temp 98.0°F | Ht 63.0 in | Wt 177.0 lb

## 2023-06-02 DIAGNOSIS — K436 Other and unspecified ventral hernia with obstruction, without gangrene: Secondary | ICD-10-CM

## 2023-06-02 DIAGNOSIS — I10 Essential (primary) hypertension: Secondary | ICD-10-CM

## 2023-06-02 NOTE — Progress Notes (Signed)
Patient ID: Lauren Burke, female   DOB: 24-Oct-1970, 52 y.o.   MRN: [id number]  Chief Complaint: Ventral hernia  History of Present Illness Lauren Burke is a 52 y.o. female with presentation to the ED September 11 with what appears to be an incisional hernia, likely to blame for a transient episode of small bowel obstruction.  Her nausea and vomiting which she presented with have resolved, however she continues to have some lingering abdominal wall pain at the hernia site.  Currently pain-free and nontender.  She has a history of colon resection, laparoscopic assisted.    Past Medical History Past Medical History:  Diagnosis Date   Depression    Hypertension       Past Surgical History:  Procedure Laterality Date   CHOLECYSTECTOMY  2000   COLECTOMY  2017    Cholecystectomy  Colon surgery 03/09/2016  Sigmoid Colectomy  Dilation and curettage of uterus 02/2021  Tonsillectomy   Allergies  Allergen Reactions   Morphine And Codeine    Sulfa Antibiotics     Current Outpatient Medications  Medication Sig Dispense Refill   ALPRAZolam (XANAX) 0.5 MG tablet Take 0.5 mg by mouth 2 (two) times daily as needed for anxiety.     amLODipine (NORVASC) 5 MG tablet Take 5 mg by mouth daily.     No current facility-administered medications for this visit.    Family History No family history on file.    Social History Social History   Tobacco Use   Smoking status: Never    Passive exposure: Past   Smokeless tobacco: Never  Vaping Use   Vaping status: Never Used  Substance Use Topics   Alcohol use: Not Currently   Drug use: Not Currently        Review of Systems  Constitutional: Negative.   HENT: Negative.    Eyes: Negative.   Respiratory: Negative.    Cardiovascular: Negative.   Gastrointestinal: Negative.   Genitourinary: Negative.   Neurological: Negative.   Psychiatric/Behavioral: Negative.       Physical Exam Blood pressure (!) 140/89, pulse 89, temperature  98 F (36.7 C), height 5\' 3"  (1.6 m), weight 177 lb (80.3 kg), last menstrual period 02/09/2018, SpO2 97%. Last Weight  Most recent update: 06/02/2023  4:10 PM    Weight  80.3 kg (177 lb)             CONSTITUTIONAL: Well developed, and nourished, appropriately responsive and aware without distress.   EYES: Sclera non-icteric.   EARS, NOSE, MOUTH AND THROAT:  The oropharynx is clear. Oral mucosa is pink and moist.    Hearing is intact to voice.  NECK: Trachea is midline, and there is no jugular venous distension.  LYMPH NODES:  Lymph nodes in the neck are not appreciated. RESPIRATORY:  Lungs are clear, and breath sounds are equal bilaterally.  Normal respiratory effort without pathologic use of accessory muscles. CARDIOVASCULAR: Heart is regular in rate and rhythm.   Well perfused.  GI: The abdomen is  soft, nontender, and nondistended. There were no palpable masses.  It was difficult to fully assess/appreciate the size of the fascial defect, I suspect to the laparoscopic assisted prior procedure but this was a small incisional defect.  CT report is not described, and the images are currently unavailable.  I did not appreciate hepatosplenomegaly. There were normal bowel sounds.   MUSCULOSKELETAL:  Symmetrical muscle tone appreciated in all four extremities.    SKIN: Skin turgor is normal. No pathologic skin lesions appreciated.  NEUROLOGIC:  Motor and sensation appear grossly normal.  Cranial nerves are grossly without defect. PSYCH:  Alert and oriented to person, place and time. Affect is appropriate for situation.  Data Reviewed I have personally reviewed what is currently available of the patient's imaging, recent labs and medical records.   Labs:     Latest Ref Rng & Units 06/04/2021    1:27 PM 06/03/2021    1:36 AM  CBC  WBC 4.0 - 10.5 K/uL 21.8  17.3   Hemoglobin 12.0 - 15.0 g/dL 64.4  03.4   Hematocrit 36.0 - 46.0 % 41.2  43.8   Platelets 150 - 400 K/uL 394  414       Latest  Ref Rng & Units 06/04/2021    1:27 PM 06/03/2021    1:36 AM  CMP  Glucose 70 - 99 mg/dL 94  742   BUN 6 - 20 mg/dL 36  15   Creatinine 5.95 - 1.00 mg/dL 6.38  7.56   Sodium 433 - 145 mmol/L 146  139   Potassium 3.5 - 5.1 mmol/L 3.8  4.2   Chloride 98 - 111 mmol/L 110  103   CO2 22 - 32 mmol/L 22  23   Calcium 8.9 - 10.3 mg/dL 29.5  9.9   Total Protein 6.5 - 8.1 g/dL 9.0  8.9   Total Bilirubin 0.3 - 1.2 mg/dL 1.5  1.5   Alkaline Phos 38 - 126 U/L 72  77   AST 15 - 41 U/L 70  31   ALT 0 - 44 U/L 27  25    Imaging: Radiological [facility]:   CT abdomen and pelvis with contrast:   INDICATION: Abdominal pain   TECHNIQUE: Axial scans were performed through the abdomen and pelvis with intravenous contrast. Contrast-65 mL Isovue-370. Radiation dose reduction was utilized (automated exposure control, mA or kV adjustment based on patient size, or iterative image reconstruction).   COMPARISON: 06/19/2016   FINDINGS:   # Lung bases: Unremarkable.   #  There is a small hiatal hernia.    #  Liver: Normal enhancement. No focal lesions.  #  Gallbladder: Cholecystectomy  #  Spleen: Normal.  #  Pancreas: Normal  #  Adrenal glands: Normal.  #  Kidneys: There are simple bilateral renal cysts. There is no hydronephrosis is no renal calculi.   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   #  Retroperitoneum: No mass or adenopathy.  #  Aorta: No significant abnormality.  Bowel and mesentery: There is a ventral abdominal hernia which contains a short segment loop of small bowel. There is fluid and inflammatory stranding within the hernia sac suggestive of an incarcerated hernia.   The proximal small bowel is distended with thickened enhancing walls and stranding of the surrounding mesentery.   The distal small bowel is collapsed recent the possibility that this could represent a early or incomplete obstruction.   The appendix  unremarkable.   The colon is poorly evaluated due to the lack of oral contrast and under distention but demonstrates no gross abnormality.   Pelvis:  # No mass or adenopathy.  #  No inflammatory changes seen.  #  Bladder: No significant abnormality.  #   Uterus is unremarkable.   There is a small amount free fluid in the pelvis which could be physiological.   #  Bony structures: No significant abnormality.   IMPRESSION:  Ventral abdominal hernia containing a short segment loop of small bowel fluid with inflammatory stranding of the herniated fat suggestive of an incarcerated hernia.   The small bowel proximal to the hernia is fluid filled with thick enhancing walls and stranding of the surrounding mesenteric fat although the bowel is not significantly dilated the possibility of a early or partial small bowel obstruction should be considered.   The distal small bowel is collapsed.   Simple bilateral renal cysts.   Hiatal hernia.   Electronically Signed by: Azzie Almas, MD on 05/25/2023 2:53 AM  Within last 24 hrs: No results found.  Assessment    Incisional hernia, anterior abdominal wall hernia with history of small bowel obstruction.  Difficult to assess fascial defect size currently. There are no problems to display for this patient.   Plan    Will attempt to obtain imaging to evaluate fascial defect, however anticipate robotic assisted anterior abdominal wall hernia repair.  I discussed possibility of incarceration, strangulation, enlargement in size over time, and the need for emergency surgery in the face of these.  Also reviewed the techniques of reduction should incarceration occur, and when unsuccessful to present to the ED.  Also discussed that surgery risks include recurrence which can be up to 30% in the case of complex hernias, use of prosthetic materials (mesh) and the increased risk of infection and the possible need for re-operation and removal of mesh,  possibility of post-op SBO or ileus, and the risks of general anesthetic including heart attack, stroke, sudden death or some reaction to anesthetic medications. The patient, and those present, appear to understand the risks, any and all questions were answered to the patient's satisfaction.  No guarantees were ever expressed or implied.  Face-to-face time spent with the patient and accompanying care providers(if present) was 40 minutes, with more than 50% of the time spent counseling, educating, and coordinating care of the patient.    These notes generated with voice recognition software. I apologize for typographical errors.  Campbell Lerner M.D., FACS 06/02/2023, 4:19 PM

## 2023-06-02 NOTE — Patient Instructions (Signed)
Please try calling again to try to get your CT images Power Shared to [facility].   You have requested to have a Ventral Hernia Repair. This will be done by Dr Claudine Mouton at [facility] Please see your (BLUE) Pre-care sheet for more information. Our surgery scheduler will call you to look at surgery dates and to go over surgery information.   You will need to arrange to be out of work for approximately 1-2 weeks and then you may return with a lifting restriction for 4 more weeks. If you have FMLA or Disability paperwork that needs to be filled out, please have your company fax your paperwork to [phone] or you may drop this by either office. This paperwork will be filled out within 3 days after your surgery has been completed.     Ventral Hernia A ventral hernia (also called an incisional hernia) is a hernia that occurs at the site of a previous surgical cut (incision) in the abdomen. The abdominal wall spans from your lower chest down to your pelvis. If the abdominal wall is weakened from a surgical incision, a hernia can occur. A hernia is a bulge of bowel or muscle tissue pushing out on the weakened part of the abdominal wall. Ventral hernias can get bigger from straining or lifting. Obese and older people are at higher risk for a ventral hernia. People who develop infections after surgery or require repeat incisions at the same site on the abdomen are also at increased risk. CAUSES  A ventral hernia occurs because of weakness in the abdominal wall at an incision site.  SYMPTOMS  Common symptoms include: A visible bulge or lump on the abdominal wall. Pain or tenderness around the lump. Increased discomfort if you cough or make a sudden movement. If the hernia has blocked part of the intestine, a serious complication can occur (incarcerated or strangulated hernia). This can become a problem that requires emergency surgery because the blood flow to the blocked intestine may be cut off. Symptoms may  include: Feeling sick to your stomach (nauseous). Throwing up (vomiting). Stomach swelling (distention) or bloating. Fever. Rapid heartbeat. DIAGNOSIS  Your health care provider will take a medical history and perform a physical exam. Various tests may be ordered, such as: Blood tests. Urine tests. Ultrasonography. X-rays. Computed tomography (CT). TREATMENT  Watchful waiting may be all that is needed for a smaller hernia that does not cause symptoms. Your health care provider may recommend the use of a supportive belt (truss) that helps to keep the abdominal wall intact. For larger hernias or those that cause pain, surgery to repair the hernia is usually recommended. If a hernia becomes strangulated, emergency surgery needs to be done right away. HOME CARE INSTRUCTIONS Avoid putting pressure or strain on the abdominal area. Avoid heavy lifting. Use good body positioning for physical tasks. Ask your health care provider about proper body positioning. Use a supportive belt as directed by your health care provider. Maintain a healthy weight. Eat foods that are high in fiber, such as whole grains, fruits, and vegetables. Fiber helps prevent difficult bowel movements (constipation). Drink enough fluids to keep your urine clear or pale yellow. Follow up with your health care provider as directed. SEEK MEDICAL CARE IF:  Your hernia seems to be getting larger or more painful. SEEK IMMEDIATE MEDICAL CARE IF:  You have abdominal pain that is sudden and sharp. Your pain becomes severe. You have repeated vomiting. You are sweating a lot. You notice a rapid heartbeat.  You develop a fever. MAKE SURE YOU:  Understand these instructions. Will watch your condition. Will get help right away if you are not doing well or get worse.     Open Ventral Hernia Repair Open ventral hernia repair is a surgery to fix a ventral hernia. A ventral hernia,  is a bulge of body tissue or intestines that  pushes through the front part of the abdomen. This can happen if the connective tissue covering the muscles over the abdomen has a weak spot or is torn because of a surgical cut (incision) from a previous surgery. A ventral hernia repair is often done soon after diagnosis to stop the hernia from getting bigger, becoming uncomfortable, or becoming an emergency. This surgery usually takes about 2 hours, but the time can vary greatly.  LET [facility] CARE PROVIDER KNOW ABOUT: Any allergies you have. All medicines you are taking, including steroids, vitamins, herbs, eye drops, creams, and over-the-counter medicines. Previous problems you or members of your family have had with the use of anesthetics. Any blood disorders you have. Previous surgeries you have had. Medical conditions you have.  RISKS AND COMPLICATIONS  Generally, Open ventral hernia repair is a safe procedure. However, as with any surgical procedure, problems can occur. Possible problems include: Bleeding. Trouble passing urine or having a bowel movement after the surgery. Infection. Pneumonia. Blood clots. Pain in the area of the hernia. A bulge in the area of the hernia that may be caused by a collection of fluid. Injury to intestines or other structures in the abdomen. Return of the hernia after surgery.  BEFORE THE PROCEDURE  You may need to have blood tests, urine tests, a chest X-ray, or an electrocardiogram done before the day of the surgery. Ask your health care provider about changing or stopping your regular medicines. This is especially important if you are taking diabetes medicines or blood thinners. You may need to wash with a special type of germ-killing soap. Do not eat or drink anything after midnight the night before the procedure or as directed by your health care provider. Make plans to have someone drive you home after the procedure.  PROCEDURE  Small monitors will be put on your body. They are used to  check your heart, blood pressure, and oxygen level. An IV access tube will be put into a vein in your hand or arm. Fluids and medicine will flow directly into your body through the IV tube. You will be given medicine that makes you go to sleep (general anesthetic). Your abdomen will be cleaned with a special soap to kill any germs on your skin. Once you are asleep, a moderate - large size incision will be made in your abdomen. The size of incision depends on how large your hernia is. Your surgeon puts the tissue or intestines that formed the hernia back in place. A screen-like patch (mesh) is used to close the hernia. This helps make the area stronger. Stitches, tacks, or staples are used to keep the mesh in place. Medicine and a bandage (dressing) or skin glue will be put over the incision.  AFTER THE PROCEDURE  You will stay in a recovery area until the anesthetic wears off. Your blood pressure and pulse will be checked often. You may be able to go home the same day or may need to stay in the hospital for 1-2 days after surgery. Your surgeon will decide when you can go home depending upon your recovery. You may feel some  pain. You will be given medicine for pain. You will be urged to do breathing exercises that involve taking deep breaths. This helps prevent a lung infection after a surgery. You may have to wear compression stockings while you are in the hospital. These stockings help keep blood clots from forming in your legs.   This information is not intended to replace advice given to you by your health care provider. Make sure you discuss any questions you have with your health care provider.   Document Released: 08/16/2012 Document Revised: 09/04/2013 Document Reviewed: 08/16/2012 Elsevier Interactive Patient Education [employer]

## 2023-06-02 NOTE — H&P (View-Only) (Signed)
Patient ID: Lauren Burke, female   DOB: 24-Oct-1970, 52 y.o.   MRN: [id number]  Chief Complaint: Ventral hernia  History of Present Illness Lauren Burke is a 52 y.o. female with presentation to the ED September 11 with what appears to be an incisional hernia, likely to blame for a transient episode of small bowel obstruction.  Her nausea and vomiting which she presented with have resolved, however she continues to have some lingering abdominal wall pain at the hernia site.  Currently pain-free and nontender.  She has a history of colon resection, laparoscopic assisted.    Past Medical History Past Medical History:  Diagnosis Date   Depression    Hypertension       Past Surgical History:  Procedure Laterality Date   CHOLECYSTECTOMY  2000   COLECTOMY  2017    Cholecystectomy  Colon surgery 03/09/2016  Sigmoid Colectomy  Dilation and curettage of uterus 02/2021  Tonsillectomy   Allergies  Allergen Reactions   Morphine And Codeine    Sulfa Antibiotics     Current Outpatient Medications  Medication Sig Dispense Refill   ALPRAZolam (XANAX) 0.5 MG tablet Take 0.5 mg by mouth 2 (two) times daily as needed for anxiety.     amLODipine (NORVASC) 5 MG tablet Take 5 mg by mouth daily.     No current facility-administered medications for this visit.    Family History No family history on file.    Social History Social History   Tobacco Use   Smoking status: Never    Passive exposure: Past   Smokeless tobacco: Never  Vaping Use   Vaping status: Never Used  Substance Use Topics   Alcohol use: Not Currently   Drug use: Not Currently        Review of Systems  Constitutional: Negative.   HENT: Negative.    Eyes: Negative.   Respiratory: Negative.    Cardiovascular: Negative.   Gastrointestinal: Negative.   Genitourinary: Negative.   Neurological: Negative.   Psychiatric/Behavioral: Negative.       Physical Exam Blood pressure (!) 140/89, pulse 89, temperature  98 F (36.7 C), height 5\' 3"  (1.6 m), weight 177 lb (80.3 kg), last menstrual period 02/09/2018, SpO2 97%. Last Weight  Most recent update: 06/02/2023  4:10 PM    Weight  80.3 kg (177 lb)             CONSTITUTIONAL: Well developed, and nourished, appropriately responsive and aware without distress.   EYES: Sclera non-icteric.   EARS, NOSE, MOUTH AND THROAT:  The oropharynx is clear. Oral mucosa is pink and moist.    Hearing is intact to voice.  NECK: Trachea is midline, and there is no jugular venous distension.  LYMPH NODES:  Lymph nodes in the neck are not appreciated. RESPIRATORY:  Lungs are clear, and breath sounds are equal bilaterally.  Normal respiratory effort without pathologic use of accessory muscles. CARDIOVASCULAR: Heart is regular in rate and rhythm.   Well perfused.  GI: The abdomen is  soft, nontender, and nondistended. There were no palpable masses.  It was difficult to fully assess/appreciate the size of the fascial defect, I suspect to the laparoscopic assisted prior procedure but this was a small incisional defect.  CT report is not described, and the images are currently unavailable.  I did not appreciate hepatosplenomegaly. There were normal bowel sounds.   MUSCULOSKELETAL:  Symmetrical muscle tone appreciated in all four extremities.    SKIN: Skin turgor is normal. No pathologic skin lesions appreciated.  NEUROLOGIC:  Motor and sensation appear grossly normal.  Cranial nerves are grossly without defect. PSYCH:  Alert and oriented to person, place and time. Affect is appropriate for situation.  Data Reviewed I have personally reviewed what is currently available of the patient's imaging, recent labs and medical records.   Labs:     Latest Ref Rng & Units 06/04/2021    1:27 PM 06/03/2021    1:36 AM  CBC  WBC 4.0 - 10.5 K/uL 21.8  17.3   Hemoglobin 12.0 - 15.0 g/dL 64.4  03.4   Hematocrit 36.0 - 46.0 % 41.2  43.8   Platelets 150 - 400 K/uL 394  414       Latest  Ref Rng & Units 06/04/2021    1:27 PM 06/03/2021    1:36 AM  CMP  Glucose 70 - 99 mg/dL 94  742   BUN 6 - 20 mg/dL 36  15   Creatinine 5.95 - 1.00 mg/dL 6.38  7.56   Sodium 433 - 145 mmol/L 146  139   Potassium 3.5 - 5.1 mmol/L 3.8  4.2   Chloride 98 - 111 mmol/L 110  103   CO2 22 - 32 mmol/L 22  23   Calcium 8.9 - 10.3 mg/dL 29.5  9.9   Total Protein 6.5 - 8.1 g/dL 9.0  8.9   Total Bilirubin 0.3 - 1.2 mg/dL 1.5  1.5   Alkaline Phos 38 - 126 U/L 72  77   AST 15 - 41 U/L 70  31   ALT 0 - 44 U/L 27  25    Imaging: Radiological [facility]:   CT abdomen and pelvis with contrast:   INDICATION: Abdominal pain   TECHNIQUE: Axial scans were performed through the abdomen and pelvis with intravenous contrast. Contrast-65 mL Isovue-370. Radiation dose reduction was utilized (automated exposure control, mA or kV adjustment based on patient size, or iterative image reconstruction).   COMPARISON: 06/19/2016   FINDINGS:   # Lung bases: Unremarkable.   #  There is a small hiatal hernia.    #  Liver: Normal enhancement. No focal lesions.  #  Gallbladder: Cholecystectomy  #  Spleen: Normal.  #  Pancreas: Normal  #  Adrenal glands: Normal.  #  Kidneys: There are simple bilateral renal cysts. There is no hydronephrosis is no renal calculi.   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   #  Retroperitoneum: No mass or adenopathy.  #  Aorta: No significant abnormality.  Bowel and mesentery: There is a ventral abdominal hernia which contains a short segment loop of small bowel. There is fluid and inflammatory stranding within the hernia sac suggestive of an incarcerated hernia.   The proximal small bowel is distended with thickened enhancing walls and stranding of the surrounding mesentery.   The distal small bowel is collapsed recent the possibility that this could represent a early or incomplete obstruction.   The appendix  unremarkable.   The colon is poorly evaluated due to the lack of oral contrast and under distention but demonstrates no gross abnormality.   Pelvis:  # No mass or adenopathy.  #  No inflammatory changes seen.  #  Bladder: No significant abnormality.  #   Uterus is unremarkable.   There is a small amount free fluid in the pelvis which could be physiological.   #  Bony structures: No significant abnormality.   IMPRESSION:  Ventral abdominal hernia containing a short segment loop of small bowel fluid with inflammatory stranding of the herniated fat suggestive of an incarcerated hernia.   The small bowel proximal to the hernia is fluid filled with thick enhancing walls and stranding of the surrounding mesenteric fat although the bowel is not significantly dilated the possibility of a early or partial small bowel obstruction should be considered.   The distal small bowel is collapsed.   Simple bilateral renal cysts.   Hiatal hernia.   Electronically Signed by: Azzie Almas, MD on 05/25/2023 2:53 AM  Within last 24 hrs: No results found.  Assessment    Incisional hernia, anterior abdominal wall hernia with history of small bowel obstruction.  Difficult to assess fascial defect size currently. There are no problems to display for this patient.   Plan    Will attempt to obtain imaging to evaluate fascial defect, however anticipate robotic assisted anterior abdominal wall hernia repair.  I discussed possibility of incarceration, strangulation, enlargement in size over time, and the need for emergency surgery in the face of these.  Also reviewed the techniques of reduction should incarceration occur, and when unsuccessful to present to the ED.  Also discussed that surgery risks include recurrence which can be up to 30% in the case of complex hernias, use of prosthetic materials (mesh) and the increased risk of infection and the possible need for re-operation and removal of mesh,  possibility of post-op SBO or ileus, and the risks of general anesthetic including heart attack, stroke, sudden death or some reaction to anesthetic medications. The patient, and those present, appear to understand the risks, any and all questions were answered to the patient's satisfaction.  No guarantees were ever expressed or implied.  Face-to-face time spent with the patient and accompanying care providers(if present) was 40 minutes, with more than 50% of the time spent counseling, educating, and coordinating care of the patient.    These notes generated with voice recognition software. I apologize for typographical errors.  Campbell Lerner M.D., FACS 06/02/2023, 4:19 PM

## 2023-06-03 ENCOUNTER — Encounter: Payer: Self-pay | Admitting: Surgery

## 2023-06-03 ENCOUNTER — Ambulatory Visit: Payer: Self-pay | Admitting: Surgery

## 2023-06-03 ENCOUNTER — Telehealth: Payer: Self-pay | Admitting: Surgery

## 2023-06-03 DIAGNOSIS — K436 Other and unspecified ventral hernia with obstruction, without gangrene: Secondary | ICD-10-CM

## 2023-06-03 DIAGNOSIS — I10 Essential (primary) hypertension: Secondary | ICD-10-CM | POA: Insufficient documentation

## 2023-06-03 HISTORY — DX: Essential (primary) hypertension: I10

## 2023-06-03 NOTE — Telephone Encounter (Signed)
Patient has been advised of Pre-Admission date/time, and Surgery date at [facility].  Surgery Date: 06/15/23 Preadmission Testing Date: 06/08/23 (phone 1p-4p)  Patient has been made aware to call [phone], between 1-3:00pm the day before surgery, to find out what time to arrive for surgery.

## 2023-06-08 ENCOUNTER — Encounter
Admission: RE | Admit: 2023-06-08 | Discharge: 2023-06-08 | Disposition: A | Discharge status: Home / Self Care | Payer: Self-pay | Source: Ambulatory Visit | Attending: Surgery | Admitting: Surgery

## 2023-06-08 HISTORY — DX: Ventral hernia without obstruction or gangrene: K43.9

## 2023-06-08 HISTORY — DX: Personal history of other diseases of the digestive system: Z87.19

## 2023-06-08 HISTORY — DX: Panic disorder (episodic paroxysmal anxiety): F41.0

## 2023-06-08 HISTORY — DX: Cyst of kidney, acquired: N28.1

## 2023-06-08 HISTORY — DX: Personal history of urinary calculi: Z87.442

## 2023-06-08 HISTORY — DX: Diverticulitis of intestine, part unspecified, without perforation or abscess without bleeding: K57.92

## 2023-06-08 NOTE — Patient Instructions (Signed)
Your procedure is scheduled on:06-15-23 Wednesday Report to the Registration Desk on the 1st floor of the Medical Mall.Then proceed to the 2nd floor Surgery Desk To find out your arrival time, please call [phone] between 1PM - 3PM on:06-14-23 Tuesday If your arrival time is 6:00 am, do not arrive before that time as the Medical Mall entrance doors do not open until 6:00 am.  REMEMBER: Instructions that are not followed completely may result in serious medical risk, up to and including death; or upon the discretion of your surgeon and anesthesiologist your surgery may need to be rescheduled.  Do not eat food OR drink any liquids after midnight the night before surgery.  No gum chewing or hard candies.  One week prior to surgery: Stop Anti-inflammatories (NSAIDS) such as Advil, Aleve, Ibuprofen, Motrin, Naproxen, Naprosyn and Aspirin based products such as Excedrin, Goody's Powder, BC Powder.You may however, take Tylenol if needed for pain up until the day of surgery. Stop ANY OVER THE COUNTER supplements/vitamins NOW (06-08-23) until after surgery.   Continue taking all prescribed medications   TAKE ONLY THESE MEDICATIONS THE MORNING OF SURGERY WITH A SIP OF WATER: -amLODipine (NORVASC)  -You may take ALPRAZolam Prudy Feeler) if needed for anxiety  No Alcohol for 24 hours before or after surgery.  No Smoking including e-cigarettes for 24 hours before surgery.  No chewable tobacco products for at least 6 hours before surgery.  No nicotine patches on the day of surgery.  Do not use any "recreational" drugs for at least a week (preferably 2 weeks) before your surgery.  Please be advised that the combination of cocaine and anesthesia may have negative outcomes, up to and including death. If you test positive for cocaine, your surgery will be cancelled.  On the morning of surgery brush your teeth with toothpaste and water, you may rinse your mouth with mouthwash if you wish. Do not swallow  any toothpaste or mouthwash.  Do not wear jewelry, make-up, hairpins, clips or nail polish.  For welded (permanent) jewelry: bracelets, anklets, waist bands, etc.  Please have this removed prior to surgery.  If it is not removed, there is a chance that hospital personnel will need to cut it off on the day of surgery.  Do not wear lotions, powders, or perfumes.   Do not shave body hair from the neck down 48 hours before surgery.  Contact lenses, hearing aids and dentures may not be worn into surgery.  Do not bring valuables to the hospital. [facility] is not responsible for any missing/lost belongings or valuables.   Notify your doctor if there is any change in your medical condition (cold, fever, infection).  Wear comfortable clothing (specific to your surgery type) to the hospital.  After surgery, you can help prevent lung complications by doing breathing exercises.  Take deep breaths and cough every 1-2 hours. Your doctor may order a device called an Incentive Spirometer to help you take deep breaths. When coughing or sneezing, hold a pillow firmly against your incision with both hands. This is called "splinting." Doing this helps protect your incision. It also decreases belly discomfort.  If you are being admitted to the hospital overnight, leave your suitcase in the car. After surgery it may be brought to your room.  In case of increased patient census, it may be necessary for you, the patient, to continue your postoperative care in the Same Day Surgery department.  If you are being discharged the day of surgery, you will not  be allowed to drive home. You will need a responsible individual to drive you home and stay with you for 24 hours after surgery.   If you are taking public transportation, you will need to have a responsible individual with you.  Please call the Pre-admissions Testing Dept. at [phone] if you have any questions about these instructions.  Surgery  Visitation Policy:  Patients having surgery or a procedure may have two visitors.  Children under the age of 64 must have an adult with them who is not the patient.

## 2023-06-15 ENCOUNTER — Ambulatory Visit: Payer: Medicaid Other

## 2023-06-15 ENCOUNTER — Encounter
Admission: RE | Disposition: A | Discharge status: Home / Self Care | Payer: Self-pay | Source: Ambulatory Visit | Attending: Surgery

## 2023-06-15 ENCOUNTER — Encounter: Payer: Self-pay | Admitting: Surgery

## 2023-06-15 ENCOUNTER — Other Ambulatory Visit: Payer: Self-pay

## 2023-06-15 ENCOUNTER — Ambulatory Visit
Admission: RE | Admit: 2023-06-15 | Discharge: 2023-06-15 | Disposition: A | Discharge status: Home / Self Care | Payer: Self-pay | Source: Ambulatory Visit | Attending: Surgery | Admitting: Surgery

## 2023-06-15 ENCOUNTER — Other Ambulatory Visit: Payer: Self-pay | Admitting: Surgery

## 2023-06-15 DIAGNOSIS — K469 Unspecified abdominal hernia without obstruction or gangrene: Secondary | ICD-10-CM | POA: Diagnosis present

## 2023-06-15 DIAGNOSIS — N281 Cyst of kidney, acquired: Secondary | ICD-10-CM | POA: Insufficient documentation

## 2023-06-15 DIAGNOSIS — Z9049 Acquired absence of other specified parts of digestive tract: Secondary | ICD-10-CM | POA: Diagnosis not present

## 2023-06-15 DIAGNOSIS — K439 Ventral hernia without obstruction or gangrene: Secondary | ICD-10-CM | POA: Insufficient documentation

## 2023-06-15 DIAGNOSIS — K436 Other and unspecified ventral hernia with obstruction, without gangrene: Secondary | ICD-10-CM

## 2023-06-15 DIAGNOSIS — I10 Essential (primary) hypertension: Secondary | ICD-10-CM | POA: Diagnosis not present

## 2023-06-15 HISTORY — PX: INSERTION OF MESH: SHX5868

## 2023-06-15 SURGERY — REPAIR, HERNIA, UMBILICAL, ROBOT-ASSISTED
Anesthesia: General | Site: Abdomen

## 2023-06-15 MED ORDER — GABAPENTIN 300 MG PO CAPS: 300.0000 mg | ORAL_CAPSULE | ORAL | Status: DC

## 2023-06-15 MED ORDER — CHLORHEXIDINE GLUCONATE 0.12 % MT SOLN
OROMUCOSAL | Status: AC
  Filled 2023-06-15: qty 15

## 2023-06-15 MED ORDER — LIDOCAINE HCL (PF) 2 % IJ SOLN
INTRAMUSCULAR | Status: AC
  Filled 2023-06-15: qty 5

## 2023-06-15 MED ORDER — ROCURONIUM BROMIDE 10 MG/ML (PF) SYRINGE
PREFILLED_SYRINGE | INTRAVENOUS | Status: AC
  Filled 2023-06-15: qty 10

## 2023-06-15 MED ORDER — DROPERIDOL 2.5 MG/ML IJ SOLN: 0.6250 mg | Freq: Once | INTRAMUSCULAR | Status: DC | PRN

## 2023-06-15 MED ORDER — HYDROCODONE-ACETAMINOPHEN 5-325 MG PO TABS: 1.0000 | ORAL_TABLET | Freq: Four times a day (QID) | ORAL | 0 refills | Status: DC | PRN

## 2023-06-15 MED ORDER — PROPOFOL 10 MG/ML IV BOLUS
INTRAVENOUS | Status: AC
  Filled 2023-06-15: qty 20

## 2023-06-15 MED ORDER — ACETAMINOPHEN 500 MG PO TABS
1000.0000 mg | ORAL_TABLET | ORAL | Status: AC
  Administered 2023-06-15: 500 mg via ORAL

## 2023-06-15 MED ORDER — ONDANSETRON HCL 4 MG/2ML IJ SOLN
INTRAMUSCULAR | Status: DC | PRN
  Administered 2023-06-15: 4 mg via INTRAVENOUS

## 2023-06-15 MED ORDER — GABAPENTIN 300 MG PO CAPS
ORAL_CAPSULE | ORAL | Status: AC
  Filled 2023-06-15: qty 1

## 2023-06-15 MED ORDER — DEXAMETHASONE SODIUM PHOSPHATE 10 MG/ML IJ SOLN
INTRAMUSCULAR | Status: DC | PRN
  Administered 2023-06-15: 10 mg via INTRAVENOUS

## 2023-06-15 MED ORDER — ONDANSETRON HCL 4 MG PO TABS: 4.0000 mg | ORAL_TABLET | Freq: Three times a day (TID) | ORAL | 0 refills | Status: AC | PRN

## 2023-06-15 MED ORDER — CHLORHEXIDINE GLUCONATE CLOTH 2 % EX PADS
6.0000 | MEDICATED_PAD | Freq: Once | CUTANEOUS | Status: AC
  Administered 2023-06-15: 6 via TOPICAL

## 2023-06-15 MED ORDER — MIDAZOLAM HCL 2 MG/2ML IJ SOLN
INTRAMUSCULAR | Status: AC
  Filled 2023-06-15: qty 2

## 2023-06-15 MED ORDER — MIDAZOLAM HCL 2 MG/2ML IJ SOLN
INTRAMUSCULAR | Status: DC | PRN
  Administered 2023-06-15: 2 mg via INTRAVENOUS

## 2023-06-15 MED ORDER — BUPIVACAINE LIPOSOME 1.3 % IJ SUSP
INTRAMUSCULAR | Status: AC
  Filled 2023-06-15: qty 20

## 2023-06-15 MED ORDER — FAMOTIDINE 20 MG PO TABS
20.0000 mg | ORAL_TABLET | Freq: Once | ORAL | Status: AC
  Administered 2023-06-15: 20 mg via ORAL

## 2023-06-15 MED ORDER — FENTANYL CITRATE (PF) 100 MCG/2ML IJ SOLN
25.0000 ug | INTRAMUSCULAR | Status: DC | PRN
  Administered 2023-06-15 (×2): 50 ug via INTRAVENOUS

## 2023-06-15 MED ORDER — OXYCODONE HCL 5 MG/5ML PO SOLN: 5.0000 mg | Freq: Once | ORAL | Status: DC | PRN

## 2023-06-15 MED ORDER — PROPOFOL 10 MG/ML IV BOLUS
INTRAVENOUS | Status: DC | PRN
  Administered 2023-06-15: 20 ug/kg/min via INTRAVENOUS
  Administered 2023-06-15: 200 mg via INTRAVENOUS
  Administered 2023-06-15: 50 mg via INTRAVENOUS

## 2023-06-15 MED ORDER — LIDOCAINE HCL (CARDIAC) PF 100 MG/5ML IV SOSY
PREFILLED_SYRINGE | INTRAVENOUS | Status: DC | PRN
  Administered 2023-06-15: 100 mg via INTRAVENOUS

## 2023-06-15 MED ORDER — CEFAZOLIN SODIUM-DEXTROSE 2-4 GM/100ML-% IV SOLN
INTRAVENOUS | Status: AC
  Filled 2023-06-15: qty 100

## 2023-06-15 MED ORDER — SUGAMMADEX SODIUM 200 MG/2ML IV SOLN
INTRAVENOUS | Status: DC | PRN
  Administered 2023-06-15: 200 mg via INTRAVENOUS

## 2023-06-15 MED ORDER — OXYCODONE HCL 5 MG PO TABS: 5.0000 mg | ORAL_TABLET | Freq: Once | ORAL | Status: DC | PRN

## 2023-06-15 MED ORDER — PHENYLEPHRINE 80 MCG/ML (10ML) SYRINGE FOR IV PUSH (FOR BLOOD PRESSURE SUPPORT)
PREFILLED_SYRINGE | INTRAVENOUS | Status: DC | PRN
  Administered 2023-06-15 (×4): 80 ug via INTRAVENOUS

## 2023-06-15 MED ORDER — DEXAMETHASONE SODIUM PHOSPHATE 10 MG/ML IJ SOLN
INTRAMUSCULAR | Status: AC
  Filled 2023-06-15: qty 1

## 2023-06-15 MED ORDER — ACETAMINOPHEN 10 MG/ML IV SOLN: 1000.0000 mg | Freq: Once | INTRAVENOUS | Status: DC | PRN

## 2023-06-15 MED ORDER — ORAL CARE MOUTH RINSE: 15.0000 mL | Freq: Once | OROMUCOSAL | Status: AC

## 2023-06-15 MED ORDER — BUPIVACAINE LIPOSOME 1.3 % IJ SUSP: 20.0000 mL | Freq: Once | INTRAMUSCULAR | Status: DC

## 2023-06-15 MED ORDER — ROCURONIUM BROMIDE 100 MG/10ML IV SOLN
INTRAVENOUS | Status: DC | PRN
  Administered 2023-06-15: 20 mg via INTRAVENOUS
  Administered 2023-06-15: 10 mg via INTRAVENOUS
  Administered 2023-06-15: 50 mg via INTRAVENOUS
  Administered 2023-06-15 (×3): 10 mg via INTRAVENOUS

## 2023-06-15 MED ORDER — FENTANYL CITRATE (PF) 100 MCG/2ML IJ SOLN
INTRAMUSCULAR | Status: AC
  Filled 2023-06-15: qty 2

## 2023-06-15 MED ORDER — BUPIVACAINE-EPINEPHRINE (PF) 0.25% -1:200000 IJ SOLN
INTRAMUSCULAR | Status: AC
  Filled 2023-06-15: qty 30

## 2023-06-15 MED ORDER — LACTATED RINGERS IV SOLN: INTRAVENOUS | Status: DC

## 2023-06-15 MED ORDER — IBUPROFEN 800 MG PO TABS: 800.0000 mg | ORAL_TABLET | Freq: Three times a day (TID) | ORAL | 0 refills | Status: AC | PRN

## 2023-06-15 MED ORDER — BUPIVACAINE-EPINEPHRINE 0.25% -1:200000 IJ SOLN
INTRAMUSCULAR | Status: DC | PRN
  Administered 2023-06-15: 50 mL

## 2023-06-15 MED ORDER — PHENYLEPHRINE 80 MCG/ML (10ML) SYRINGE FOR IV PUSH (FOR BLOOD PRESSURE SUPPORT)
PREFILLED_SYRINGE | INTRAVENOUS | Status: AC
  Filled 2023-06-15: qty 10

## 2023-06-15 MED ORDER — ACETAMINOPHEN 500 MG PO TABS
ORAL_TABLET | ORAL | Status: AC
  Filled 2023-06-15: qty 2

## 2023-06-15 MED ORDER — FAMOTIDINE 20 MG PO TABS
ORAL_TABLET | ORAL | Status: AC
  Filled 2023-06-15: qty 1

## 2023-06-15 MED ORDER — CEFAZOLIN SODIUM-DEXTROSE 2-4 GM/100ML-% IV SOLN
2.0000 g | INTRAVENOUS | Status: AC
  Administered 2023-06-15 (×2): 2 g via INTRAVENOUS

## 2023-06-15 MED ORDER — FENTANYL CITRATE (PF) 100 MCG/2ML IJ SOLN
INTRAMUSCULAR | Status: DC | PRN
  Administered 2023-06-15 (×2): 50 ug via INTRAVENOUS

## 2023-06-15 MED ORDER — CHLORHEXIDINE GLUCONATE 0.12 % MT SOLN
15.0000 mL | Freq: Once | OROMUCOSAL | Status: AC
  Administered 2023-06-15: 15 mL via OROMUCOSAL

## 2023-06-15 MED ORDER — ONDANSETRON HCL 4 MG/2ML IJ SOLN
INTRAMUSCULAR | Status: AC
  Filled 2023-06-15: qty 2

## 2023-06-15 SURGICAL SUPPLY — 52 items
ADH SKN CLS APL DERMABOND .7 (GAUZE/BANDAGES/DRESSINGS) ×1
APL PRP STRL LF DISP 70% ISPRP (MISCELLANEOUS)
CHLORAPREP W/TINT 26 (MISCELLANEOUS) ×1 IMPLANT
COVER TIP SHEARS 8 DVNC (MISCELLANEOUS) ×1 IMPLANT
COVER WAND RF STERILE (DRAPES) ×1 IMPLANT
DERMABOND ADVANCED .7 DNX12 (GAUZE/BANDAGES/DRESSINGS) ×1 IMPLANT
DRAPE ARM DVNC X/XI (DISPOSABLE) ×3 IMPLANT
DRAPE COLUMN DVNC XI (DISPOSABLE) ×1 IMPLANT
ELECT REM PT RETURN 9FT ADLT (ELECTROSURGICAL) ×1
ELECTRODE REM PT RTRN 9FT ADLT (ELECTROSURGICAL) ×1 IMPLANT
FORCEPS BPLR R/ABLATION 8 DVNC (INSTRUMENTS) ×1 IMPLANT
GLOVE ORTHO TXT STRL SZ7.5 (GLOVE) ×2 IMPLANT
GOWN STRL REUS W/ TWL LRG LVL3 (GOWN DISPOSABLE) ×1 IMPLANT
GOWN STRL REUS W/ TWL XL LVL3 (GOWN DISPOSABLE) ×2 IMPLANT
GOWN STRL REUS W/TWL LRG LVL3 (GOWN DISPOSABLE) ×1
GOWN STRL REUS W/TWL XL LVL3 (GOWN DISPOSABLE) ×2
GRASPER SUT TROCAR 14GX15 (MISCELLANEOUS) IMPLANT
IRRIGATION STRYKERFLOW (MISCELLANEOUS) IMPLANT
IRRIGATOR STRYKERFLOW (MISCELLANEOUS)
IV NS IRRIG 3000ML ARTHROMATIC (IV SOLUTION) IMPLANT
KIT PINK PAD W/HEAD ARE REST (MISCELLANEOUS) ×1
KIT PINK PAD W/HEAD ARM REST (MISCELLANEOUS) ×1 IMPLANT
MANIFOLD NEPTUNE II (INSTRUMENTS) ×1 IMPLANT
MESH PROLENE PML 12X12 (Mesh General) IMPLANT
NDL DRIVE SUT CUT DVNC (INSTRUMENTS) ×1 IMPLANT
NDL HYPO 22X1.5 SAFETY MO (MISCELLANEOUS) ×1 IMPLANT
NDL INSUFFLATION 14GA 120MM (NEEDLE) ×1 IMPLANT
NEEDLE DRIVE SUT CUT DVNC (INSTRUMENTS) ×1 IMPLANT
NEEDLE HYPO 22X1.5 SAFETY MO (MISCELLANEOUS) ×1 IMPLANT
NEEDLE INSUFFLATION 14GA 120MM (NEEDLE) ×1 IMPLANT
NS IRRIG 500ML POUR BTL (IV SOLUTION) ×1 IMPLANT
PACK LAP CHOLECYSTECTOMY (MISCELLANEOUS) ×1 IMPLANT
SCISSORS METZENBAUM CVD 33 (INSTRUMENTS) IMPLANT
SCISSORS MNPLR CVD DVNC XI (INSTRUMENTS) ×1 IMPLANT
SEAL UNIV 5-12 XI (MISCELLANEOUS) ×3 IMPLANT
SET TUBE SMOKE EVAC HIGH FLOW (TUBING) ×1 IMPLANT
SOL ELECTROSURG ANTI STICK (MISCELLANEOUS) ×1
SOLUTION ELECTROSURG ANTI STCK (MISCELLANEOUS) ×1 IMPLANT
SPIKE FLUID TRANSFER (MISCELLANEOUS) ×1 IMPLANT
SUT DVC VLOC 180 0 12IN GS21 (SUTURE)
SUT MNCRL 4-0 (SUTURE) ×1
SUT MNCRL 4-0 27XMFL (SUTURE) ×1
SUT STRATAFIX 0 PDS+ CT-2 23 (SUTURE) ×2
SUT V-LOC 90 ABS 3-0 VLT V-20 (SUTURE) IMPLANT
SUT VIC AB 2-0 CT2 27 (SUTURE) IMPLANT
SUT VICRYL 0 UR6 27IN ABS (SUTURE) ×1 IMPLANT
SUT VLOC 90 2/L VL 12 GS22 (SUTURE) IMPLANT
SUT VLOC 90 S/L VL9 GS22 (SUTURE) ×2 IMPLANT
SUTURE DVC VLC 180 0 12IN GS21 (SUTURE) ×1 IMPLANT
SUTURE MNCRL 4-0 27XMF (SUTURE) ×1 IMPLANT
SUTURE STRATFX 0 PDS+ CT-2 23 (SUTURE) IMPLANT
TROCAR Z-THREAD FIOS 11X100 BL (TROCAR) ×1 IMPLANT

## 2023-06-15 NOTE — Op Note (Signed)
Robotic assisted laparoscopic initial anterior abdominal hernia repair > 10 cm.    Pre-operative Diagnosis: Anterior abdominal hernia, preop estimated fascial defect size  > 3 cm.   Post-operative Diagnosis: same, but a series of midline defects add to greater than 10 cm midline defect.  See photos.    Surgeon:  Campbell Lerner, M.D., FACS   Anesthesia: Gen. with endotracheal tube   Findings:  14 CM fascial defect.  Estimated Blood Loss: 15 mL   Complications: none           Procedure Details  The patient was seen again in the Holding Room. The benefits, complications, treatment options, and expected outcomes were discussed with the patient. The risks of bleeding, infection, recurrence of symptoms, failure to resolve symptoms, bowel injury, mesh placement, mesh infection, any of which could require further surgery were reviewed with the patient. The likelihood of improving the patient's symptoms with return to their baseline status is good.  The patient and/or family concurred with the proposed plan, giving informed consent.  The patient was taken to Operating Room, identified and the procedure verified.  A Time Out was held and the above information confirmed.   Prior to the induction of general anesthesia, antibiotic prophylaxis was administered. VTE prophylaxis was in place. General endotracheal anesthesia was then administered and tolerated well. After the induction, the abdomen was prepped with Chloraprep and draped in the sterile fashion. The patient was positioned in the supine position. After local infiltration of quarter percent Marcaine with epinephrine, stab incision was made left upper quadrant.  Just below the costal margin approximately midclavicular line the Veress needle is passed with sensation of the layers to penetrate the abdominal wall and into the peritoneum.  Saline drop test is confirmed peritoneal placement.  Insufflation is initiated with carbon dioxide to pressures of  15 mmHg. Marcaine quarter percent  with Exparel was used to inject all the incision sites. We used a left upper quadrant subcostal incision and using an [employer] FIOS 11 mm trocar, it was inserted with direct visualization and pneumoperitoneum maintained.  No hemodynamic compromise, no evidence of intraperitoneal injury.   2 additional 8 mm ports were placed under direct visualization on the left lateral abdomen.  I visualized the hernia and there was *** contents within a *** hernia.    The robot was brought to the surgical field and docked in the standard fashion.  We made sure that all instrumentation was kept under direct vision at all times and there was no collision between the arms.  I scrubbed out and went to the console. Falciform and adjacent preperitoneal adipose and umbilical ligaments and hernia sac were taken down with electrocautery to allow adequate mesh placement to be secured to the fascial tissue. Confirm and measured that the defect was *** cm.  .  Using a 0 Stratafix suture we closed the ventral defect primarily.  At this time I went ahead and inserted the *** mesh with a ***. *** The PMI was used to pierce the defect in the center.  Using the mesh and the echo location device we were able to bring the mesh towards the abdominal wall. *** I used the same suture to secure the mesh and centered it over the fascial closure.    The mesh was secured circumferentially to the abdominal wall using 2-0 V-lock in the standard fashion.   The mesh appeared well secured against the  abdominal wall. A second look laparoscopy revealed no evidence of intra-abdominal  injury.    All the needles and ECHO device *** were removed under direct visualization.  The instruments were removed and the robot was undocked.  The laparoscopic ports were removed under direct visualization and the pneumoperitoneum was deflated.   The fascial sutures approximated in the standard fashion, *** utilizing the  PMI under direct visualization. Incisions were closed with  4-0 Monocryl   Dermabond was used to coat the skin.  Patient tolerated procedure well and there were no immediate complications. Needle and laparotomy counts were correct   Abdominal binder applied.  Campbell Lerner M.D., [facility] 06/15/2023 3:55 PM

## 2023-06-15 NOTE — Transfer of Care (Signed)
Immediate Anesthesia Transfer of Care Note  Patient: Lauren Burke  Procedure(s) Performed: XI ROBOT ASSISTED UMBILICAL HERNIA REPAIR (Abdomen) INSERTION OF MESH (Abdomen)  Patient Location: PACU  Anesthesia Type:General  Level of Consciousness: drowsy and responds to stimulation  Airway & Oxygen Therapy: Patient Spontanous Breathing and Patient connected to face mask oxygen  Post-op Assessment: Report given to RN and Post -op Vital signs reviewed and stable  Post vital signs: Reviewed and stable  Last Vitals:  Vitals Value Taken Time  BP 130/96 06/15/23 1545  Temp 36.6 C 06/15/23 1545  Pulse 69 06/15/23 1550  Resp 17 06/15/23 1550  SpO2 100 % 06/15/23 1550  Vitals shown include unfiled device data.  Last Pain:  Vitals:   06/15/23 1545  PainSc: Asleep      Patients Stated Pain Goal: 0 (06/15/23 1056)  Complications: No notable events documented.

## 2023-06-15 NOTE — Interval H&P Note (Signed)
History and Physical Interval Note:  06/15/2023 11:14 AM  Lauren Burke  has presented today for surgery, with the diagnosis of AA hernia 3-10 cm initial reducible.  The various methods of treatment have been discussed with the patient and family. After consideration of risks, benefits and other options for treatment, the patient has consented to  Procedure(s): XI ROBOT ASSISTED UMBILICAL HERNIA REPAIR (N/A) as a surgical intervention.  The patient's history has been reviewed, patient examined, no change in status, stable for surgery.  I have reviewed the patient's chart and labs.  Questions were answered to the patient's satisfaction.     Campbell Lerner

## 2023-06-15 NOTE — Anesthesia Postprocedure Evaluation (Signed)
Anesthesia Post Note  Patient: Lurleen Ozimek  Procedure(s) Performed: XI ROBOT ASSISTED UMBILICAL HERNIA REPAIR (Abdomen) INSERTION OF MESH (Abdomen)  Patient location during evaluation: PACU Anesthesia Type: General Level of consciousness: awake and alert Pain management: pain level controlled Vital Signs Assessment: post-procedure vital signs reviewed and stable Respiratory status: spontaneous breathing, nonlabored ventilation, respiratory function stable and patient connected to nasal cannula oxygen Cardiovascular status: blood pressure returned to baseline and stable Postop Assessment: no apparent nausea or vomiting Anesthetic complications: no   No notable events documented.   Last Vitals:  Vitals:   06/15/23 1645 06/15/23 1710  BP: 122/83 (!) 123/91  Pulse: 71 80  Resp: 11 14  Temp: 36.7 C 36.7 C  SpO2: 99% 100%    Last Pain:  Vitals:   06/15/23 1710  TempSrc: Temporal  PainSc: 4                  Cleda Mccreedy Retha Bither

## 2023-06-15 NOTE — Anesthesia Procedure Notes (Signed)
Procedure Name: Intubation Date/Time: 06/15/2023 11:49 AM  Performed by: Carolynne Edouard, RNPre-anesthesia Checklist: Patient identified, Patient being monitored, Timeout performed, Emergency Drugs available and Suction available Patient Re-evaluated:Patient Re-evaluated prior to induction Oxygen Delivery Method: Circle system utilized Preoxygenation: Pre-oxygenation with 100% oxygen Induction Type: IV induction Ventilation: Mask ventilation without difficulty and Oral airway inserted - appropriate to patient size Laryngoscope Size: Mac and 3 Grade View: Grade I Tube type: Oral Tube size: 7.0 mm Number of attempts: 1 Airway Equipment and Method: Stylet Placement Confirmation: ETT inserted through vocal cords under direct vision, positive ETCO2 and breath sounds checked- equal and bilateral Secured at: 21 cm Tube secured with: Tape Dental Injury: Teeth and Oropharynx as per pre-operative assessment

## 2023-06-15 NOTE — Discharge Instructions (Addendum)
AMBULATORY SURGERY  DISCHARGE INSTRUCTIONS   The drugs that you were given will stay in your system until tomorrow so for the next 24 hours you should not:  Drive an automobile Make any legal decisions Drink any alcoholic beverage   You may resume regular meals tomorrow.  Today it is better to start with liquids and gradually work up to solid foods.  You may eat anything you prefer, but it is better to start with liquids, then soup and crackers, and gradually work up to solid foods.   Please notify your doctor immediately if you have any unusual bleeding, trouble breathing, redness and pain at the surgery site, drainage, fever, or pain not relieved by medication.    Additional Instructions:  DO NOT REMOVE TEAL EXPAREL BRACELET FOR 4 DAYS, 96 hours, 06/19/2023  Information for Discharge Teaching: EXPAREL (bupivacaine liposome injectable suspension)   Pain relief is important to your recovery. The goal is to control your pain so you can move easier and return to your normal activities as soon as possible after your procedure. Your physician may use several types of medicines to manage pain, swelling, and more.  Your surgeon or anesthesiologist gave you EXPAREL(bupivacaine) to help control your pain after surgery.  EXPAREL is a local anesthetic designed to release slowly over an extended period of time to provide pain relief by numbing the tissue around the surgical site. EXPAREL is designed to release pain medication over time and can control pain for up to 72 hours. Depending on how you respond to EXPAREL, you may require less pain medication during your recovery. EXPAREL can help reduce or eliminate the need for opioids during the first few days after surgery when pain relief is needed the most. EXPAREL is not an opioid and is not addictive. It does not cause sleepiness or sedation.   Important! A teal colored band has been placed on your arm with the date, time and amount of  EXPAREL you have received. Please leave this armband in place for the full 96 hours following administration, and then you may remove the band. If you return to the hospital for any reason within 96 hours following the administration of EXPAREL, the armband provides important information that your health care providers to know, and alerts them that you have received this anesthetic.    Possible side effects of EXPAREL: Temporary loss of sensation or ability to move in the area where medication was injected. Nausea, vomiting, constipation Rarely, numbness and tingling in your mouth or lips, lightheadedness, or anxiety may occur. Call your doctor right away if you think you may be experiencing any of these sensations, or if you have other questions regarding possible side effects.  Follow all other discharge instructions given to you by your surgeon or nurse. Eat a healthy diet and drink plenty of water or other fluids.        Please contact your physician with any problems or Same Day Surgery at [phone], Monday through Friday 6 am to 4 pm, or [place] [facility] number at [phone].

## 2023-06-15 NOTE — Anesthesia Preprocedure Evaluation (Signed)
Anesthesia Evaluation  Patient identified by MRN, date of birth, ID band Patient awake    Reviewed: Allergy & Precautions, H&P , NPO status , Patient's Chart, lab work & pertinent test results  Airway Mallampati: II  TM Distance: >3 FB Neck ROM: full    Dental no notable dental hx.    Pulmonary neg pulmonary ROS   Pulmonary exam normal        Cardiovascular hypertension, Normal cardiovascular exam     Neuro/Psych  PSYCHIATRIC DISORDERS      negative neurological ROS     GI/Hepatic Neg liver ROS, hiatal hernia,,,  Endo/Other  negative endocrine ROS    Renal/GU Renal InsufficiencyRenal disease     Musculoskeletal   Abdominal   Peds  Hematology negative hematology ROS (+)   Anesthesia Other Findings Past Medical History: No date: Depression No date: Diverticulitis 06/03/2023: Essential hypertension No date: History of hiatal hernia No date: History of kidney stones No date: Hypertension No date: Panic attacks No date: Renal cyst     Comment:  bilateral No date: Ventral hernia  Past Surgical History: 2000: CHOLECYSTECTOMY 2017: COLECTOMY No date: DILATION AND CURETTAGE OF UTERUS No date: KIDNEY STONE SURGERY No date: TONSILLECTOMY     Comment:  age 52     Reproductive/Obstetrics negative OB ROS                              Anesthesia Physical Anesthesia Plan  ASA: 2  Anesthesia Plan: General ETT   Post-op Pain Management: Ofirmev IV (intra-op)* and Toradol IV (intra-op)*   Induction: Intravenous  PONV Risk Score and Plan: 4 or greater and Ondansetron, Dexamethasone, Midazolam and Droperidol  Airway Management Planned: Oral ETT  Additional Equipment:   Intra-op Plan:   Post-operative Plan: Extubation in OR  Informed Consent:      Dental Advisory Given  Plan Discussed with: CRNA and Surgeon  Anesthesia Plan Comments:          Anesthesia Quick  Evaluation

## 2023-06-16 ENCOUNTER — Encounter: Payer: Self-pay | Admitting: Surgery

## 2023-06-29 ENCOUNTER — Encounter: Payer: Self-pay | Admitting: Physician Assistant

## 2023-06-29 ENCOUNTER — Ambulatory Visit (INDEPENDENT_AMBULATORY_CARE_PROVIDER_SITE_OTHER): Payer: Medicaid Other | Admitting: Physician Assistant

## 2023-06-29 VITALS — BP 145/90 | HR 82 | Temp 98.6°F | Ht 63.0 in | Wt 177.8 lb

## 2023-06-29 DIAGNOSIS — Z09 Encounter for follow-up examination after completed treatment for conditions other than malignant neoplasm: Secondary | ICD-10-CM | POA: Diagnosis not present

## 2023-06-29 DIAGNOSIS — K436 Other and unspecified ventral hernia with obstruction, without gangrene: Secondary | ICD-10-CM

## 2023-06-29 DIAGNOSIS — K469 Unspecified abdominal hernia without obstruction or gangrene: Secondary | ICD-10-CM

## 2023-06-29 MED ORDER — HYDROCODONE-ACETAMINOPHEN 5-325 MG PO TABS: 1.0000 | ORAL_TABLET | Freq: Four times a day (QID) | ORAL | 0 refills | Status: AC | PRN

## 2023-06-29 NOTE — Progress Notes (Unsigned)
[facility] POST-OP OFFICE VISIT  06/30/2023  HPI: Lauren Burke is a 52 y.o. female 14 days s/p robotic assisted laparoscopic abdominal wall hernia repair (>10 cm) with Dr Claudine Mouton   She is overall doing well Abdominal soreness is improving; notices more at the end of the day No fever, chills, nausea, emesis, bowel changes Incisions are healing well She is utilizing abdominal binder; reports this helps Ambulating well No other complaints   Vital signs: BP (!) 145/90 (BP Location: Left Arm, Patient Position: Sitting, Cuff Size: Small)   Pulse 82   Temp 98.6 F (37 C) (Oral)   Ht 5\' 3"  (1.6 m)   Wt 177 lb 12.8 oz (80.6 kg)   LMP 02/09/2018 (Approximate)   SpO2 97%   BMI 31.50 kg/m    Physical Exam: Constitutional: Well appearing female, NAD Abdomen: Soft, incisional soreness, non-distended, no rebound/guarding Skin: Laparoscopic incisions are healing well, no erythema or drainage   Assessment/Plan: This is a 52 y.o. female 14 days s/p robotic assisted laparoscopic abdominal wall hernia repair (>10 cm) with Dr Claudine Mouton    - Pain control prn; will refill oxycodone one more time. This is the last refill of this. She will use this as needed and QHS  - Reviewed wound care recommendation  - Continue to utilize abdominal binder for at least 4 weeks   - Reviewed lifting restrictions; 6 weeks total  - She can follow up on as needed basis; She understands to call with questions/concerns  Face-to-face time spent with the patient and care providers was 20 minutes, with more than 50% of the time spent counseling, educating, and coordinating care of the patient.     -- Lynden Oxford, PA-C [facility] 06/30/2023, 9:08 AM M-F: 7am - 4pm

## 2023-06-29 NOTE — Patient Instructions (Signed)

## 2023-08-04 ENCOUNTER — Encounter: Payer: Medicaid Other | Admitting: Surgery

## 2023-08-04 DIAGNOSIS — Z8719 Personal history of other diseases of the digestive system: Secondary | ICD-10-CM | POA: Insufficient documentation

## 2023-08-04 NOTE — Progress Notes (Deleted)
[facility] POST-OP OFFICE VISIT  08/04/2023  HPI: Lauren Burke is a 52 y.o. female s/p robotic assisted laparoscopic abdominal wall hernia repair (>10 cm) June 15, 2023.  A 12 x 25 cm mesh was utilized.   Vital signs: LMP 02/09/2018 (Approximate)    Physical Exam: Constitutional: *** Abdomen: *** Skin: ***  Assessment/Plan: This is a 52 y.o. female s/p robotic assisted laparoscopic abdominal wall hernia repair (>10 cm) on June 15, 2023.   Patient Active Problem List   Diagnosis Date Noted   Ventral hernia with bowel obstruction 06/15/2023   Essential hypertension 06/03/2023    - ***   Campbell Lerner M.D., FACS 08/04/2023, 8:49 AM

## 2023-08-09 ENCOUNTER — Encounter: Payer: Medicaid Other | Admitting: Surgery

## 2023-08-25 ENCOUNTER — Encounter: Payer: Medicaid Other | Admitting: Surgery

## 2023-08-31 NOTE — Progress Notes (Deleted)
[facility] POST-OP OFFICE VISIT  08/31/2023  HPI: Lauren Burke is a 52 y.o. female s/p robotic assisted laparoscopic abdominal wall hernia repair (>10 cm) June 15, 2023.  A 12 x 25 cm mesh was utilized.   Vital signs: LMP 02/09/2018 (Approximate)    Physical Exam: Constitutional: *** Abdomen: *** Skin: ***  Assessment/Plan: This is a 52 y.o. female s/p robotic assisted laparoscopic abdominal wall hernia repair (>10 cm) on June 15, 2023.   Patient Active Problem List   Diagnosis Date Noted   S/P repair of ventral hernia 08/04/2023   Ventral hernia with bowel obstruction 06/15/2023   Essential hypertension 06/03/2023    - ***   Campbell Lerner M.D., FACS 08/31/2023, 12:36 PM

## 2023-09-01 ENCOUNTER — Encounter: Payer: Medicaid Other | Admitting: Surgery
